# Patient Record
Sex: Female | Born: 1950 | Race: White | Hispanic: No | Smoking: Never smoker
Health system: Southern US, Community
[De-identification: ages and names within clinical notes are randomized; demographics above are authoritative.]

## PROBLEM LIST (undated history)

## (undated) DIAGNOSIS — I10 Essential (primary) hypertension: Secondary | ICD-10-CM

## (undated) DIAGNOSIS — M8580 Other specified disorders of bone density and structure, unspecified site: Secondary | ICD-10-CM

## (undated) DIAGNOSIS — E7800 Pure hypercholesterolemia, unspecified: Secondary | ICD-10-CM

## (undated) DIAGNOSIS — R42 Dizziness and giddiness: Secondary | ICD-10-CM

## (undated) DIAGNOSIS — R519 Headache, unspecified: Secondary | ICD-10-CM

## (undated) DIAGNOSIS — M1990 Unspecified osteoarthritis, unspecified site: Secondary | ICD-10-CM

## (undated) DIAGNOSIS — M545 Low back pain, unspecified: Secondary | ICD-10-CM

## (undated) DIAGNOSIS — R413 Other amnesia: Secondary | ICD-10-CM

## (undated) DIAGNOSIS — G43109 Migraine with aura, not intractable, without status migrainosus: Secondary | ICD-10-CM

## (undated) DIAGNOSIS — F419 Anxiety disorder, unspecified: Secondary | ICD-10-CM

## (undated) HISTORY — DX: Pure hypercholesterolemia, unspecified: E78.00

## (undated) HISTORY — DX: Unspecified osteoarthritis, unspecified site: M19.90

## (undated) HISTORY — DX: Headache, unspecified: R51.9

## (undated) HISTORY — DX: Other amnesia: R41.3

## (undated) HISTORY — DX: Low back pain, unspecified: M54.50

## (undated) HISTORY — DX: Migraine with aura, not intractable, without status migrainosus: G43.109

## (undated) HISTORY — DX: Other specified disorders of bone density and structure, unspecified site: M85.80

## (undated) HISTORY — DX: Dizziness and giddiness: R42

## (undated) HISTORY — DX: Anxiety disorder, unspecified: F41.9

## (undated) HISTORY — PX: TOTAL HIP ARTHROPLASTY: SHX124

## (undated) HISTORY — DX: Essential (primary) hypertension: I10

## (undated) HISTORY — PX: JOINT REPLACEMENT: SHX530

---
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
## 1999-04-08 ENCOUNTER — Other Ambulatory Visit: Admission: RE | Admit: 1999-04-08 | Discharge: 1999-04-08 | Payer: Self-pay | Admitting: Obstetrics and Gynecology

## 2001-10-22 ENCOUNTER — Other Ambulatory Visit: Admission: RE | Admit: 2001-10-22 | Discharge: 2001-10-22 | Payer: Self-pay | Admitting: Obstetrics and Gynecology

## 2001-10-30 ENCOUNTER — Encounter: Admission: RE | Admit: 2001-10-30 | Discharge: 2001-10-30 | Payer: Self-pay

## 2001-10-30 ENCOUNTER — Encounter: Payer: Self-pay | Admitting: Obstetrics and Gynecology

## 2003-05-07 ENCOUNTER — Other Ambulatory Visit: Admission: RE | Admit: 2003-05-07 | Discharge: 2003-05-07 | Payer: Self-pay | Admitting: Obstetrics and Gynecology

## 2003-06-11 ENCOUNTER — Ambulatory Visit (HOSPITAL_COMMUNITY): Admission: RE | Admit: 2003-06-11 | Discharge: 2003-06-11 | Payer: Self-pay | Admitting: Specialist

## 2003-12-22 ENCOUNTER — Encounter
Admission: RE | Admit: 2003-12-22 | Discharge: 2003-12-22 | Payer: Self-pay | Admitting: Physical Medicine and Rehabilitation

## 2004-09-19 ENCOUNTER — Encounter: Admission: RE | Admit: 2004-09-19 | Discharge: 2004-09-19 | Payer: Self-pay | Admitting: Obstetrics and Gynecology

## 2007-06-28 ENCOUNTER — Encounter: Admission: RE | Admit: 2007-06-28 | Discharge: 2007-06-28 | Payer: Self-pay | Admitting: Obstetrics and Gynecology

## 2008-01-21 ENCOUNTER — Encounter: Admission: RE | Admit: 2008-01-21 | Discharge: 2008-01-21 | Payer: Self-pay | Admitting: Obstetrics and Gynecology

## 2010-06-26 ENCOUNTER — Encounter: Payer: Self-pay | Admitting: Obstetrics and Gynecology

## 2013-02-27 ENCOUNTER — Ambulatory Visit (INDEPENDENT_AMBULATORY_CARE_PROVIDER_SITE_OTHER): Payer: BC Managed Care – PPO | Admitting: General Practice

## 2013-02-27 ENCOUNTER — Encounter: Payer: Self-pay | Admitting: General Practice

## 2013-02-27 VITALS — BP 111/79 | HR 53 | Temp 98.0°F | Ht 68.0 in | Wt 130.0 lb

## 2013-02-27 DIAGNOSIS — Z0000 Encounter for general adult medical examination without abnormal findings: Secondary | ICD-10-CM

## 2013-02-27 DIAGNOSIS — Z124 Encounter for screening for malignant neoplasm of cervix: Secondary | ICD-10-CM

## 2013-02-27 DIAGNOSIS — Z833 Family history of diabetes mellitus: Secondary | ICD-10-CM

## 2013-02-27 DIAGNOSIS — Z8349 Family history of other endocrine, nutritional and metabolic diseases: Secondary | ICD-10-CM

## 2013-02-27 LAB — POCT CBC
Granulocyte percent: 68.4 %G (ref 37–80)
HCT, POC: 39.7 % (ref 37.7–47.9)
Hemoglobin: 13.3 g/dL (ref 12.2–16.2)
MCV: 90 fL (ref 80–97)
POC Granulocyte: 4.4 (ref 2–6.9)
RBC: 4.4 M/uL (ref 4.04–5.48)
RDW, POC: 12.8 %

## 2013-02-27 LAB — POCT GLYCOSYLATED HEMOGLOBIN (HGB A1C): Hemoglobin A1C: 5.3

## 2013-02-27 NOTE — Progress Notes (Signed)
  Subjective:    Patient ID: Adrienne Jacobs, female    DOB: [birth year], 62 y.o.   MRN: [id number]  HPI Patient presents today for annual physical. She reports having a clogged ear sensation and onset was two months ago.     Review of Systems  Constitutional: Negative for fever and chills.  HENT: Negative for neck pain and neck stiffness.   Respiratory: Negative for chest tightness and shortness of breath.   Cardiovascular: Negative for chest pain and palpitations.  Gastrointestinal: Negative for vomiting, abdominal pain, diarrhea, constipation and blood in stool.  Genitourinary: Negative for dysuria, frequency, flank pain, difficulty urinating and pelvic pain.  Skin: Negative for rash.  Neurological: Negative for dizziness, weakness and headaches.       Objective:   Physical Exam  Constitutional: She is oriented to person, place, and time. She appears well-developed and well-nourished.  HENT:  Head: Normocephalic and atraumatic.  Mouth/Throat: Oropharynx is clear and moist.  Bilateral cerumen impaction  Eyes: Conjunctivae and EOM are normal. Pupils are equal, round, and reactive to light.  Neck: Normal range of motion. Neck supple. No thyromegaly present.  Cardiovascular: Normal rate, regular rhythm, normal heart sounds and intact distal pulses.   Pulmonary/Chest: Effort normal and breath sounds normal. No respiratory distress. Right breast exhibits no inverted nipple, no mass, no nipple discharge, no skin change and no tenderness. Left breast exhibits no inverted nipple, no mass, no nipple discharge, no skin change and no tenderness. Breasts are symmetrical.  Abdominal: Soft. Bowel sounds are normal. She exhibits no distension.  Genitourinary: Vagina normal and uterus normal. No breast swelling, tenderness, discharge or bleeding. No labial fusion. There is no rash, tenderness, lesion or injury on the right labia. There is no rash, tenderness, lesion or injury on the left labia. Uterus  is not deviated, not enlarged, not fixed and not tender. Cervix exhibits no motion tenderness, no discharge and no friability. Right adnexum displays no mass, no tenderness and no fullness. Left adnexum displays no mass, no tenderness and no fullness. No erythema, tenderness or bleeding around the vagina. No foreign body around the vagina. No signs of injury around the vagina. No vaginal discharge found.  Lymphadenopathy:    She has no cervical adenopathy.  Neurological: She is alert and oriented to person, place, and time.  Skin: Skin is warm and dry.  Psychiatric: She has a normal mood and affect.          Assessment & Plan:  1. Family history of diabetes mellitus - POCT glycosylated hemoglobin (Hb A1C)  2. Family history of hypothyroidism - Thyroid Panel With TSH  3. Annual physical exam - POCT CBC - CMP14+EGFR - Pap IG w/ reflex to HPV when ASC-U -RTO if symptoms develop -Patient verbalized understanding Coralie Keens, FNP-C

## 2013-02-27 NOTE — Patient Instructions (Addendum)

## 2013-02-28 LAB — CMP14+EGFR
ALT: 15 IU/L (ref 0–32)
Albumin/Globulin Ratio: 2.4 (ref 1.1–2.5)
Albumin: 4.5 g/dL (ref 3.6–4.8)
BUN: 13 mg/dL (ref 8–27)
Calcium: 9.9 mg/dL (ref 8.6–10.2)
GFR calc Af Amer: 89 mL/min/{1.73_m2} (ref >59)
GFR calc non Af Amer: 78 mL/min/{1.73_m2} (ref >59)
Glucose: 87 mg/dL (ref 65–99)
Potassium: 4.7 mmol/L (ref 3.5–5.2)
Total Bilirubin: 0.2 mg/dL (ref 0.0–1.2)
Total Protein: 6.4 g/dL (ref 6.0–8.5)

## 2013-02-28 LAB — THYROID PANEL WITH TSH
Free Thyroxine Index: 1.7 (ref 1.2–4.9)
T3 Uptake Ratio: 27 % (ref 24–39)
TSH: 2.8 u[IU]/mL (ref 0.450–4.500)

## 2013-03-05 LAB — PAP IG W/ RFLX HPV ASCU

## 2013-03-07 ENCOUNTER — Telehealth: Payer: Self-pay | Admitting: General Practice

## 2013-03-10 NOTE — Telephone Encounter (Signed)
Please review patients labs she is calling

## 2013-03-12 NOTE — Telephone Encounter (Signed)
Pt aware and rescheduled for pap.

## 2013-03-18 ENCOUNTER — Ambulatory Visit (INDEPENDENT_AMBULATORY_CARE_PROVIDER_SITE_OTHER): Payer: Self-pay | Admitting: General Practice

## 2013-03-18 ENCOUNTER — Encounter: Payer: Self-pay | Admitting: General Practice

## 2013-03-18 VITALS — BP 90/64 | HR 56 | Temp 98.3°F

## 2013-03-18 DIAGNOSIS — Z0000 Encounter for general adult medical examination without abnormal findings: Secondary | ICD-10-CM

## 2013-03-18 NOTE — Progress Notes (Signed)
  Subjective:    Patient ID: Adrienne Jacobs, female    DOB: 05/17/1951, 62 y.o.   MRN: [id number]  HPI Patient presents today for repeat pap, due to insufficient cells. Denies any changes.     Review of Systems  Constitutional: Negative for fever and chills.  Respiratory: Negative for chest tightness and shortness of breath.   Cardiovascular: Negative for chest pain and palpitations.  Gastrointestinal: Negative for vomiting, abdominal pain, diarrhea, constipation and blood in stool.  Genitourinary: Negative for dysuria, frequency, flank pain, difficulty urinating and pelvic pain.  Musculoskeletal: Negative for neck pain and neck stiffness.  Skin: Negative for rash.  Neurological: Negative for dizziness, weakness and headaches.       Objective:   Physical Exam  Constitutional: She is oriented to person, place, and time. She appears well-developed and well-nourished.  HENT:  Head: Normocephalic and atraumatic.  Right Ear: External ear normal.  Left Ear: External ear normal.  Mouth/Throat: Oropharynx is clear and moist.  Eyes: Conjunctivae and EOM are normal. Pupils are equal, round, and reactive to light.  Neck: Normal range of motion. Neck supple. No thyromegaly present.  Cardiovascular: Normal rate, regular rhythm, normal heart sounds and intact distal pulses.   Pulmonary/Chest: Effort normal and breath sounds normal. No respiratory distress. Right breast exhibits no inverted nipple, no mass, no nipple discharge, no skin change and no tenderness. Left breast exhibits no inverted nipple, no mass, no nipple discharge, no skin change and no tenderness. Breasts are symmetrical.  Abdominal: Soft. Bowel sounds are normal. She exhibits no distension.  Genitourinary: No breast swelling, tenderness, discharge or bleeding. No labial fusion. There is no rash, tenderness, lesion or injury on the right labia. There is no rash, tenderness, lesion or injury on the left labia. Uterus is not deviated,  not enlarged, not fixed and not tender. Cervix exhibits no motion tenderness, no discharge and no friability. Right adnexum displays no mass, no tenderness and no fullness. Left adnexum displays no mass, no tenderness and no fullness. No erythema, tenderness or bleeding around the vagina. No foreign body around the vagina. No signs of injury around the vagina. No vaginal discharge found.  Lymphadenopathy:    She has no cervical adenopathy.  Neurological: She is alert and oriented to person, place, and time.  Skin: Skin is warm and dry.  Psychiatric: She has a normal mood and affect.          Assessment & Plan:  1. Annual physical exam - Pap IG w/ reflex to HPV when ASC-U -will notify of results -Patient verbalized understanding Coralie Keens, FNP-C

## 2013-03-21 LAB — PAP IG W/ RFLX HPV ASCU

## 2014-07-16 ENCOUNTER — Ambulatory Visit (INDEPENDENT_AMBULATORY_CARE_PROVIDER_SITE_OTHER): Payer: Commercial Managed Care - HMO | Admitting: Nurse Practitioner

## 2014-07-16 ENCOUNTER — Encounter: Payer: Self-pay | Admitting: Nurse Practitioner

## 2014-07-16 ENCOUNTER — Ambulatory Visit (INDEPENDENT_AMBULATORY_CARE_PROVIDER_SITE_OTHER): Payer: Commercial Managed Care - HMO

## 2014-07-16 ENCOUNTER — Encounter (INDEPENDENT_AMBULATORY_CARE_PROVIDER_SITE_OTHER): Payer: Self-pay

## 2014-07-16 VITALS — BP 106/72 | HR 63 | Temp 97.2°F | Ht 68.0 in | Wt 134.0 lb

## 2014-07-16 DIAGNOSIS — R1031 Right lower quadrant pain: Secondary | ICD-10-CM | POA: Diagnosis not present

## 2014-07-16 DIAGNOSIS — M25561 Pain in right knee: Secondary | ICD-10-CM

## 2014-07-16 NOTE — Progress Notes (Signed)
   Subjective:    Patient ID: Adrienne Jacobs, female    DOB: August 06, 1950, 64 y.o.   MRN: [id number]  HPI Patient in today c/o: - right knee pain that started 1 month ago- mainly on medial side- denies injury- sitting cross legged increases pain. Straightening leg out decreases pain. - swelling in bil groin area and above pubic bone- intermittent pain in right groin area- pain occurs 2-3 x a day- rates pain 4/10. Patient has gained over 15 lbs in last few years.     Review of Systems  Constitutional: Negative.   HENT: Negative.   Respiratory: Negative.   Cardiovascular: Negative.   Gastrointestinal: Positive for constipation (on going for years).  Genitourinary: Negative.   Neurological: Negative.   Psychiatric/Behavioral: Negative.   All other systems reviewed and are negative.      Objective:   Physical Exam  Constitutional: She is oriented to person, place, and time. She appears well-developed and well-nourished.  Cardiovascular: Normal rate, regular rhythm and normal heart sounds.   Pulmonary/Chest: Effort normal and breath sounds normal.  Abdominal: Soft. She exhibits no distension and no mass. There is no tenderness. There is no rebound and no guarding.  Musculoskeletal:  FROM of right knee - no crepitus-  No effusion Ligaments intact   Neurological: She is alert and oriented to person, place, and time.  Skin: Skin is warm and dry.  Psychiatric: She has a normal mood and affect. Her behavior is normal. Judgment and thought content normal.   BP 106/72 mmHg  Pulse 63  Temp(Src) 97.2 F (36.2 C) (Oral)  Ht 5\' 8"  (1.727 m)  Wt 134 lb (60.782 kg)  BMI 20.38 kg/m2   Right knee xray- normal with good joint space-Preliminary reading by Ronnald Collum, FNP  [facility]      Assessment & Plan:  1. Right knee pain Rest No deep knee bends mobic 15mg  1 po qd #30 3 refills - DG Knee 1-2 Views Right; Future  2. Groin pain, right Keep diary of episodes Nothing found on  examination  Mary-Margaret Hassell Done, FNP

## 2014-07-16 NOTE — Patient Instructions (Signed)

## 2014-07-21 ENCOUNTER — Telehealth: Payer: Self-pay

## 2014-07-21 NOTE — Telephone Encounter (Signed)
Wants a referral 

## 2014-07-21 NOTE — Telephone Encounter (Signed)
Wants referral for what

## 2014-07-22 ENCOUNTER — Other Ambulatory Visit: Payer: Self-pay

## 2014-07-22 DIAGNOSIS — M25552 Pain in left hip: Secondary | ICD-10-CM

## 2014-07-22 DIAGNOSIS — M25561 Pain in right knee: Secondary | ICD-10-CM

## 2014-07-22 DIAGNOSIS — C4490 Unspecified malignant neoplasm of skin, unspecified: Secondary | ICD-10-CM

## 2014-07-23 ENCOUNTER — Ambulatory Visit: Payer: Self-pay | Admitting: Family

## 2014-07-31 DIAGNOSIS — Z9889 Other specified postprocedural states: Secondary | ICD-10-CM | POA: Diagnosis not present

## 2014-07-31 DIAGNOSIS — M25561 Pain in right knee: Secondary | ICD-10-CM | POA: Diagnosis not present

## 2014-07-31 DIAGNOSIS — M1811 Unilateral primary osteoarthritis of first carpometacarpal joint, right hand: Secondary | ICD-10-CM | POA: Diagnosis not present

## 2014-10-01 ENCOUNTER — Encounter: Payer: Self-pay | Admitting: Physician Assistant

## 2014-10-01 ENCOUNTER — Ambulatory Visit (INDEPENDENT_AMBULATORY_CARE_PROVIDER_SITE_OTHER): Payer: Commercial Managed Care - HMO

## 2014-10-01 ENCOUNTER — Ambulatory Visit (INDEPENDENT_AMBULATORY_CARE_PROVIDER_SITE_OTHER): Payer: Commercial Managed Care - HMO | Admitting: Physician Assistant

## 2014-10-01 VITALS — BP 124/63 | HR 55 | Temp 97.7°F | Ht 68.0 in | Wt 135.0 lb

## 2014-10-01 DIAGNOSIS — R3915 Urgency of urination: Secondary | ICD-10-CM | POA: Diagnosis not present

## 2014-10-01 DIAGNOSIS — K5900 Constipation, unspecified: Secondary | ICD-10-CM

## 2014-10-01 DIAGNOSIS — R140 Abdominal distension (gaseous): Secondary | ICD-10-CM

## 2014-10-01 DIAGNOSIS — N3090 Cystitis, unspecified without hematuria: Secondary | ICD-10-CM

## 2014-10-01 LAB — POCT URINALYSIS DIPSTICK
Bilirubin, UA: NEGATIVE
GLUCOSE UA: NEGATIVE
KETONES UA: NEGATIVE
Nitrite, UA: NEGATIVE
Protein, UA: NEGATIVE
Urobilinogen, UA: NEGATIVE
pH, UA: 7.5

## 2014-10-01 LAB — POCT UA - MICROSCOPIC ONLY
Casts, Ur, LPF, POC: NEGATIVE
Crystals, Ur, HPF, POC: NEGATIVE
MUCUS UA: NEGATIVE
YEAST UA: NEGATIVE

## 2014-10-01 MED ORDER — POLYETHYLENE GLYCOL 3350 17 GM/SCOOP PO POWD: 17.0000 g | Freq: Every day | ORAL | Status: DC

## 2014-10-01 MED ORDER — CIPROFLOXACIN HCL 500 MG PO TABS: 500.0000 mg | ORAL_TABLET | Freq: Two times a day (BID) | ORAL | Status: DC

## 2014-10-01 NOTE — Progress Notes (Signed)
   Subjective:    Patient ID: Adrienne Jacobs, female    DOB: 1950/10/21, 64 y.o.   MRN: [id number]  HPI 64 y/o female presents with c/o bloating in abdomen and increased "puffiness" in suprapubic area. Increased urination. States that she "feels like she did when she has pms" Post menopausal, stopped menstruating in her mid 95's. Last pelvic exam was 2 years ago. Intermittent constipation      Review of Systems  Gastrointestinal: Positive for constipation (BM daily, intermittent constipation) and abdominal distention. Negative for nausea, vomiting, abdominal pain, diarrhea and blood in stool.  Genitourinary: Positive for urgency and frequency. Negative for dysuria, hematuria, flank pain, vaginal discharge, difficulty urinating, vaginal pain, menstrual problem and pelvic pain.       Objective:   Physical Exam  Constitutional: She is oriented to person, place, and time. She appears well-developed and well-nourished. No distress.  Cardiovascular: Normal rate and regular rhythm.   Abdominal: Soft. She exhibits no distension and no mass. There is no tenderness. There is no rebound and no guarding.  Neurological: She is alert and oriented to person, place, and time.  Skin: She is not diaphoretic. No erythema.  Psychiatric: She has a normal mood and affect. Her behavior is normal. Judgment and thought content normal.  Nursing note and vitals reviewed.         Assessment & Plan:  1. Urgency of urination  - POCT UA - Microscopic Only - POCT urinalysis dipstick - ciprofloxacin (CIPRO) 500 MG tablet; Take 1 tablet (500 mg total) by mouth 2 (two) times daily.  Dispense: 20 tablet; Refill: 0  2. Non-gaseous abdominal distention  - DG Abd 1 View  3. Abdominal bloating  - DG Abd 1 View  4. Constipation, unspecified constipation type  - DG Abd 1 View - polyethylene glycol powder (GLYCOLAX/MIRALAX) powder; Take 17 g by mouth daily.  Dispense: 850 g; Refill: 1  5. Cystitis  -  ciprofloxacin (CIPRO) 500 MG tablet; Take 1 tablet (500 mg total) by mouth 2 (two) times daily.  Dispense: 20 tablet; Refill: 0    RTO 3 weeks   Shirely Toren A. Benjamin Stain PA-C

## 2014-10-01 NOTE — Patient Instructions (Signed)
Urinary Tract Infection Urinary tract infections (UTIs) can develop anywhere along your urinary tract. Your urinary tract is your body's drainage system for removing wastes and extra water. Your urinary tract includes two kidneys, two ureters, a bladder, and a urethra. Your kidneys are a pair of bean-shaped organs. Each kidney is about the size of your fist. They are located below your ribs, one on each side of your spine. CAUSES Infections are caused by microbes, which are microscopic organisms, including fungi, viruses, and bacteria. These organisms are so small that they can only be seen through a microscope. Bacteria are the microbes that most commonly cause UTIs. SYMPTOMS  Symptoms of UTIs may vary by age and gender of the patient and by the location of the infection. Symptoms in young women typically include a frequent and intense urge to urinate and a painful, burning feeling in the bladder or urethra during urination. Older women and men are more likely to be tired, shaky, and weak and have muscle aches and abdominal pain. A fever may mean the infection is in your kidneys. Other symptoms of a kidney infection include pain in your back or sides below the ribs, nausea, and vomiting. DIAGNOSIS To diagnose a UTI, your caregiver will ask you about your symptoms. Your caregiver also will ask to provide a urine sample. The urine sample will be tested for bacteria and white blood cells. White blood cells are made by your body to help fight infection. TREATMENT  Typically, UTIs can be treated with medication. Because most UTIs are caused by a bacterial infection, they usually can be treated with the use of antibiotics. The choice of antibiotic and length of treatment depend on your symptoms and the type of bacteria causing your infection. HOME CARE INSTRUCTIONS  If you were prescribed antibiotics, take them exactly as your caregiver instructs you. Finish the medication even if you feel better after you  have only taken some of the medication.  Drink enough water and fluids to keep your urine clear or pale yellow.  Avoid caffeine, tea, and carbonated beverages. They tend to irritate your bladder.  Empty your bladder often. Avoid holding urine for long periods of time.  Empty your bladder before and after sexual intercourse.  After a bowel movement, women should cleanse from front to back. Use each tissue only once. SEEK MEDICAL CARE IF:   You have back pain.  You develop a fever.  Your symptoms do not begin to resolve within 3 days. SEEK IMMEDIATE MEDICAL CARE IF:   You have severe back pain or lower abdominal pain.  You develop chills.  You have nausea or vomiting.  You have continued burning or discomfort with urination. MAKE SURE YOU:   Understand these instructions.  Will watch your condition.  Will get help right away if you are not doing well or get worse. Document Released: 03/01/2005 Document Revised: 11/21/2011 Document Reviewed: 06/30/2011 [facility] Patient Information 2015 [place], [place]. This information is not intended to replace advice given to you by your health care provider. Make sure you discuss any questions you have with your health care provider. Constipation Constipation is when a person has fewer than three bowel movements a week, has difficulty having a bowel movement, or has stools that are dry, hard, or larger than normal. As people grow older, constipation is more common. If you try to fix constipation with medicines that make you have a bowel movement (laxatives), the problem may get worse. Long-term laxative use may cause the muscles  of the colon to become weak. A low-fiber diet, not taking in enough fluids, and taking certain medicines may make constipation worse.  CAUSES   Certain medicines, such as antidepressants, pain medicine, iron supplements, antacids, and water pills.   Certain diseases, such as diabetes, irritable bowel syndrome (IBS),  thyroid disease, or depression.   Not drinking enough water.   Not eating enough fiber-rich foods.   Stress or travel.   Lack of physical activity or exercise.   Ignoring the urge to have a bowel movement.   Using laxatives too much.  SIGNS AND SYMPTOMS   Having fewer than three bowel movements a week.   Straining to have a bowel movement.   Having stools that are hard, dry, or larger than normal.   Feeling full or bloated.   Pain in the lower abdomen.   Not feeling relief after having a bowel movement.  DIAGNOSIS  Your health care provider will take a medical history and perform a physical exam. Further testing may be done for severe constipation. Some tests may include:  A barium enema X-ray to examine your rectum, colon, and, sometimes, your small intestine.   A sigmoidoscopy to examine your lower colon.   A colonoscopy to examine your entire colon. TREATMENT  Treatment will depend on the severity of your constipation and what is causing it. Some dietary treatments include drinking more fluids and eating more fiber-rich foods. Lifestyle treatments may include regular exercise. If these diet and lifestyle recommendations do not help, your health care provider may recommend taking over-the-counter laxative medicines to help you have bowel movements. Prescription medicines may be prescribed if over-the-counter medicines do not work.  HOME CARE INSTRUCTIONS   Eat foods that have a lot of fiber, such as fruits, vegetables, whole grains, and beans.  Limit foods high in fat and processed sugars, such as french fries, hamburgers, cookies, candies, and soda.   A fiber supplement may be added to your diet if you cannot get enough fiber from foods.   Drink enough fluids to keep your urine clear or pale yellow.   Exercise regularly or as directed by your health care provider.   Go to the restroom when you have the urge to go. Do not hold it.   Only take  over-the-counter or prescription medicines as directed by your health care provider. Do not take other medicines for constipation without talking to your health care provider first.  [place] IF:   You have bright red blood in your stool.   Your constipation lasts for more than 4 days or gets worse.   You have abdominal or rectal pain.   You have thin, pencil-like stools.   You have unexplained weight loss. MAKE SURE YOU:   Understand these instructions.  Will watch your condition.  Will get help right away if you are not doing well or get worse. Document Released: 02/18/2004 Document Revised: 05/27/2013 Document Reviewed: 03/03/2013 [facility] Patient Information 2015 [place], [place]. This information is not intended to replace advice given to you by your health care provider. Make sure you discuss any questions you have with your health care provider. Bloating Bloating is the feeling of fullness in your belly. You may feel as though your pants are too tight. Often the cause of bloating is overeating, retaining fluids, or having gas in your bowel. It is also caused by swallowing air and eating foods that cause gas. Irritable bowel syndrome is one of the most common causes of  bloating. Constipation is also a common cause. Sometimes more serious problems can cause bloating. SYMPTOMS  Usually there is a feeling of fullness, as though your abdomen is bulged out. There may be mild discomfort.  DIAGNOSIS  Usually no particular testing is necessary for most bloating. If the condition persists and seems to become worse, your caregiver may do additional testing.  TREATMENT   There is no direct treatment for bloating.  Do not put gas into the bowel. Avoid chewing gum and sucking on candy. These tend to make you swallow air. Swallowing air can also be a nervous habit. Try to avoid this.  Avoiding high residue diets will help. Eat foods with soluble fibers (examples  include root vegetables, apples, or barley) and substitute dairy products with soy and rice products. This helps irritable bowel syndrome.  If constipation is the cause, then a high residue diet with more fiber will help.  Avoid carbonated beverages.  Over-the-counter preparations are available that help reduce gas. Your pharmacist can help you with this. SEEK MEDICAL CARE IF:   Bloating continues and seems to be getting worse.  You notice a weight gain.  You have a weight loss but the bloating is getting worse.  You have changes in your bowel habits or develop nausea or vomiting. SEEK IMMEDIATE MEDICAL CARE IF:   You develop shortness of breath or swelling in your legs.  You have an increase in abdominal pain or develop chest pain. Document Released: 03/22/2006 Document Revised: 08/14/2011 Document Reviewed: 05/10/2007 [facility] Patient Information 2015 [place], [place]. This information is not intended to replace advice given to you by your health care provider. Make sure you discuss any questions you have with your health care provider.

## 2014-10-01 NOTE — Addendum Note (Signed)
Addended by: Earlene Plater on: 10/01/2014 05:45 PM   Modules accepted: Orders

## 2014-10-01 NOTE — Addendum Note (Signed)
Addended by: Earlene Plater on: 10/01/2014 05:10 PM   Modules accepted: Orders

## 2014-10-02 LAB — URINE CULTURE

## 2014-10-09 ENCOUNTER — Telehealth: Payer: Self-pay | Admitting: Nurse Practitioner

## 2014-10-10 NOTE — Telephone Encounter (Signed)
If her bowel movements are larger than before, once daily is ok. Do not take medicine on an empty stomach.    Adrienne Jacobs Stain PA-C

## 2014-10-12 NOTE — Telephone Encounter (Signed)
Bowel movements aren't any larger. Suggested taking it twice daily and to increase water intake.  If no improvement within a few days she will call back.  Patient stated understanding and agreement to plan.

## 2014-10-22 ENCOUNTER — Encounter: Payer: Self-pay | Admitting: Physician Assistant

## 2014-10-22 ENCOUNTER — Ambulatory Visit (INDEPENDENT_AMBULATORY_CARE_PROVIDER_SITE_OTHER): Payer: Commercial Managed Care - HMO | Admitting: Physician Assistant

## 2014-10-22 VITALS — BP 106/68 | HR 51 | Temp 97.4°F | Ht 68.0 in | Wt 135.4 lb

## 2014-10-22 DIAGNOSIS — Z0000 Encounter for general adult medical examination without abnormal findings: Secondary | ICD-10-CM | POA: Diagnosis not present

## 2014-10-22 DIAGNOSIS — R351 Nocturia: Secondary | ICD-10-CM | POA: Diagnosis not present

## 2014-10-22 DIAGNOSIS — R358 Other polyuria: Secondary | ICD-10-CM | POA: Diagnosis not present

## 2014-10-22 DIAGNOSIS — R3589 Other polyuria: Secondary | ICD-10-CM

## 2014-10-22 DIAGNOSIS — K5900 Constipation, unspecified: Secondary | ICD-10-CM | POA: Diagnosis not present

## 2014-10-22 DIAGNOSIS — R3915 Urgency of urination: Secondary | ICD-10-CM | POA: Diagnosis not present

## 2014-10-22 DIAGNOSIS — R3581 Nocturnal polyuria: Secondary | ICD-10-CM

## 2014-10-22 LAB — POCT CBC
Granulocyte percent: 70.5 %G (ref 37–80)
HCT, POC: 41.7 % (ref 37.7–47.9)
HEMOGLOBIN: 12.9 g/dL (ref 12.2–16.2)
Lymph, poc: 1.4 (ref 0.6–3.4)
MCH, POC: 28.7 pg (ref 27–31.2)
MCHC: 31 g/dL — AB (ref 31.8–35.4)
MCV: 92.5 fL (ref 80–97)
MPV: 7.8 fL (ref 0–99.8)
POC GRANULOCYTE: 4.1 (ref 2–6.9)
POC LYMPH %: 24.1 % (ref 10–50)
Platelet Count, POC: 250 10*3/uL (ref 142–424)
RBC: 4.51 M/uL (ref 4.04–5.48)
RDW, POC: 12.7 %
WBC: 5.8 10*3/uL (ref 4.6–10.2)

## 2014-10-22 NOTE — Progress Notes (Signed)
   Subjective:    Patient ID: Adrienne Jacobs, female    DOB: January 24, 1951, 64 y.o.   MRN: [id number]  HPI 64 y/o female presents for 3 week recheck of abdominal bloat and fullness. Xray at last visit indicated large amount of stool in abdomen. She has been taking Miralax twice daily with 1 large bowel movement daily. She feels like the c/o "fullness" and bloat has decreased with the Miralax. However, she is still have urinary frequency and has to get up twice during the night to urinate. She does not take any medications that would cause this. Supplements include Magnesium, Calcium, Msm, Vit C , B complex qam. She drinks a lot of water, coffee in the am. She states that she feels like she has to urinate every 1/2 hour x several months, which is unusual for her.     Review of Systems  Endocrine: Positive for polyuria.  Genitourinary: Positive for urgency and frequency. Negative for dysuria and difficulty urinating.       Objective:   Physical Exam  Constitutional: She is oriented to person, place, and time. She appears well-developed and well-nourished. No distress.  HENT:  Head: Normocephalic and atraumatic.  Cardiovascular:  Bradycardic   Pulmonary/Chest: Effort normal.  Abdominal: Soft. Bowel sounds are normal. She exhibits no distension and no mass. There is tenderness (mild suprapubic). There is no rebound and no guarding.  Neurological: She is alert and oriented to person, place, and time.  Skin: She is not diaphoretic.  Psychiatric: She has a normal mood and affect. Her behavior is normal. Judgment and thought content normal.  Nursing note and vitals reviewed.         Assessment & Plan:  1. Polyuria  - POCT CBC - CMP14+EGFR  2. Nocturnal polyuria  - POCT CBC - CMP14+EGFR  3. Preventative health care  - Thyroid Panel With TSH - Vit D  25 hydroxy (rtn osteoporosis monitoring)  4. Constipation, unspecified constipation type - continue Miralax as directed daily     Will wait until lab results are obtained to determine follow up and further treatment plan. May refer to Urology if labs WNL  Tony Granquist A. Benjamin Stain PA-C

## 2014-10-23 LAB — THYROID PANEL WITH TSH
FREE THYROXINE INDEX: 1.6 (ref 1.2–4.9)
T3 UPTAKE RATIO: 23 % — AB (ref 24–39)
T4 TOTAL: 7.1 ug/dL (ref 4.5–12.0)
TSH: 2.94 u[IU]/mL (ref 0.450–4.500)

## 2014-10-23 LAB — CMP14+EGFR
ALK PHOS: 73 IU/L (ref 39–117)
ALT: 14 IU/L (ref 0–32)
AST: 21 IU/L (ref 0–40)
Albumin/Globulin Ratio: 1.9 (ref 1.1–2.5)
Albumin: 4.3 g/dL (ref 3.6–4.8)
BILIRUBIN TOTAL: 0.2 mg/dL (ref 0.0–1.2)
BUN/Creatinine Ratio: 12 (ref 11–26)
BUN: 9 mg/dL (ref 8–27)
CALCIUM: 10.1 mg/dL (ref 8.7–10.3)
CHLORIDE: 96 mmol/L — AB (ref 97–108)
CO2: 25 mmol/L (ref 18–29)
Creatinine, Ser: 0.78 mg/dL (ref 0.57–1.00)
GFR calc Af Amer: 92 mL/min/{1.73_m2} (ref >59)
GFR calc non Af Amer: 80 mL/min/{1.73_m2} (ref >59)
GLUCOSE: 91 mg/dL (ref 65–99)
Globulin, Total: 2.3 g/dL (ref 1.5–4.5)
POTASSIUM: 4.1 mmol/L (ref 3.5–5.2)
Sodium: 138 mmol/L (ref 134–144)
TOTAL PROTEIN: 6.6 g/dL (ref 6.0–8.5)

## 2014-10-23 LAB — VITAMIN D 25 HYDROXY (VIT D DEFICIENCY, FRACTURES): Vit D, 25-Hydroxy: 44.8 ng/mL (ref 30.0–100.0)

## 2014-10-26 NOTE — Addendum Note (Signed)
Addended by: Jamelle Haring on: 10/26/2014 12:13 PM   Modules accepted: Orders

## 2014-11-10 ENCOUNTER — Telehealth: Payer: Self-pay | Admitting: Physician Assistant

## 2014-11-10 ENCOUNTER — Other Ambulatory Visit: Payer: Self-pay | Admitting: Physician Assistant

## 2014-11-10 DIAGNOSIS — K5900 Constipation, unspecified: Secondary | ICD-10-CM

## 2014-11-10 MED ORDER — POLYETHYLENE GLYCOL 3350 17 GM/SCOOP PO POWD: 17.0000 g | Freq: Every day | ORAL | Status: DC

## 2014-11-10 NOTE — Telephone Encounter (Signed)
Prescription sent to pharmacy  Kentarius Partington A. Reakwon Barren PA-C   

## 2014-11-10 NOTE — Telephone Encounter (Signed)
Pt notified RX sent into pharmacy 

## 2014-11-10 NOTE — Telephone Encounter (Signed)
Please review

## 2014-11-11 ENCOUNTER — Other Ambulatory Visit: Payer: Self-pay | Admitting: *Deleted

## 2014-11-11 ENCOUNTER — Telehealth: Payer: Self-pay | Admitting: Family

## 2014-11-11 DIAGNOSIS — K5900 Constipation, unspecified: Secondary | ICD-10-CM

## 2014-11-11 MED ORDER — POLYETHYLENE GLYCOL 3350 17 GM/SCOOP PO POWD: 17.0000 g | Freq: Two times a day (BID) | ORAL | Status: DC | PRN

## 2014-11-11 NOTE — Telephone Encounter (Signed)
Rx sent to pharmacy. Patient notified. 

## 2014-12-29 ENCOUNTER — Telehealth: Payer: Self-pay | Admitting: Nurse Practitioner

## 2014-12-29 NOTE — Telephone Encounter (Signed)
Patient wanted to discuss what each lab was for and review result again.

## 2015-04-02 ENCOUNTER — Telehealth: Payer: Self-pay | Admitting: Nurse Practitioner

## 2015-05-06 DIAGNOSIS — N3941 Urge incontinence: Secondary | ICD-10-CM | POA: Diagnosis not present

## 2015-05-06 DIAGNOSIS — R358 Other polyuria: Secondary | ICD-10-CM | POA: Diagnosis not present

## 2015-05-10 ENCOUNTER — Telehealth: Payer: Self-pay | Admitting: Family Medicine

## 2015-05-10 NOTE — Telephone Encounter (Signed)
Spoke with patient and went over options of having a mammogram. I discussed the importance of having a mammogram with the patient.

## 2015-07-01 ENCOUNTER — Ambulatory Visit (INDEPENDENT_AMBULATORY_CARE_PROVIDER_SITE_OTHER): Payer: Commercial Managed Care - HMO | Admitting: Pediatrics

## 2015-07-01 ENCOUNTER — Encounter: Payer: Self-pay | Admitting: Pediatrics

## 2015-07-01 VITALS — BP 116/70 | HR 55 | Temp 97.1°F | Ht 68.0 in | Wt 141.0 lb

## 2015-07-01 DIAGNOSIS — R42 Dizziness and giddiness: Secondary | ICD-10-CM | POA: Diagnosis not present

## 2015-07-01 DIAGNOSIS — H9193 Unspecified hearing loss, bilateral: Secondary | ICD-10-CM | POA: Diagnosis not present

## 2015-07-01 DIAGNOSIS — R739 Hyperglycemia, unspecified: Secondary | ICD-10-CM

## 2015-07-01 DIAGNOSIS — D485 Neoplasm of uncertain behavior of skin: Secondary | ICD-10-CM

## 2015-07-01 DIAGNOSIS — Z1239 Encounter for other screening for malignant neoplasm of breast: Secondary | ICD-10-CM | POA: Diagnosis not present

## 2015-07-01 DIAGNOSIS — L821 Other seborrheic keratosis: Secondary | ICD-10-CM | POA: Diagnosis not present

## 2015-07-01 DIAGNOSIS — Z1211 Encounter for screening for malignant neoplasm of colon: Secondary | ICD-10-CM

## 2015-07-01 DIAGNOSIS — R358 Other polyuria: Secondary | ICD-10-CM | POA: Diagnosis not present

## 2015-07-01 DIAGNOSIS — R3589 Other polyuria: Secondary | ICD-10-CM

## 2015-07-01 LAB — POCT GLYCOSYLATED HEMOGLOBIN (HGB A1C): HEMOGLOBIN A1C: 5.4

## 2015-07-01 NOTE — Progress Notes (Signed)
Subjective:    Patient ID: Tynae Dreessen, female    DOB: [birth year], 65 y.o.   MRN: [id number]  CC: multiple problem f/u, sore tail bone  HPI: Sharline Schomburg is a 65 y.o. female presenting for multiple complaints  Hearing getting worse. 2 years ago had hearing test, thinks hearing slightly worse, consonants are hardest. Doesn't want hearing aides yet.  Not working now, was Control and instrumentation engineer, used to [employer], now feels [employer] is not as good as it once was for small things. Has not been misplacing her keys, doesn't forget where she is going.  Fell through cloth chair 1 month ago. Has had tail bone pain since then. Had hip replacement 2 years ago, didn't go back to work after that. Thinks hip pain was worsened after the fall.  Hip has been doing pretty well since replacement  Regular exercising, swimming  An hour twice a week Started doing it for her back. Hip pain not preventing her from staying active.  Dizzy spells: ongoing problem for over a year. Lasts for a few seconds then goes away, not happening regularly, a couple times a month.  Has several irritated spots on her skin around waist band. Also with small growth on R inner thigh that catches on clothes, would like to have it removed.   Depression screen [facility] 2/9 07/01/2015 07/16/2014  Decreased Interest 0 1  Down, Depressed, Hopeless 0 1  PHQ - 2 Score 0 2  Altered sleeping - 1  Tired, decreased energy - 1  Change in appetite - 1  Feeling bad or failure about yourself  - 1  Trouble concentrating - 1  Moving slowly or fidgety/restless - 1  Suicidal thoughts - 0  PHQ-9 Score - 8     Relevant past medical, surgical, family and social history reviewed and updated as indicated. Interim medical history since our last visit reviewed. Allergies and medications reviewed and updated.    ROS: Per HPI unless specifically indicated above  History  Smoking status  . Never Smoker   Smokeless tobacco  . Not on  file    Past Medical History There are no active problems to display for this patient.   No current outpatient prescriptions on file.   No current facility-administered medications for this visit.       Objective:    BP 116/70 mmHg  Pulse 55  Temp(Src) 97.1 F (36.2 C) (Oral)  Ht 5\' 8"  (1.727 m)  Wt 141 lb (63.957 kg)  BMI 21.44 kg/m2  Wt Readings from Last 3 Encounters:  07/01/15 141 lb (63.957 kg)  10/22/14 135 lb 6.4 oz (61.417 kg)  10/01/14 135 lb (61.236 kg)     Gen: NAD, alert, cooperative with exam, NCAT EYES: EOMI, no scleral injection or icterus ENT:  TMs pearly gray b/l, OP without erythema LYMPH: no cervical LAD CV: NRRR, normal S1/S2, no murmur, distal pulses 2+ b/l Resp: CTABL, no wheezes, normal WOB Abd: +BS, soft, NTND. no guarding or organomegaly Ext: No edema, warm Neuro: Alert and oriented, strength equal b/l UE and LE, coordination grossly normal MSK: normal muscle bulk Skin: several SKs around waist, 33mm wide verucous growth R inner thigh     Assessment & Plan:    Chrisandra was seen today for multiple complaints.  Diagnoses and all orders for this visit:  Hearing loss, bilateral -     Ambulatory referral to Audiology  Screening for breast cancer Already has appt for mammogram.  Screening for colon  cancer No prior colonoscopy -     Ambulatory referral to Gastroenterology  Polyuria -     POCT glycosylated hemoglobin (Hb A1C)  Hyperglycemia -     POCT glycosylated hemoglobin (Hb A1C)  Vertigo Intermittent problem, not effecting life regularly, symptoms unchanged for months. Let me know if worsening. Discussed Epley maneuver she can try at home. Can refer to PT for vestibular rehab if symptoms continue.  Seborrheic keratoses RTC for likely cosmetic removal of SKs.  Neoplasm of uncertain behavior of skin RTC for removal.     Follow up plan: Prn for skin exam as above, biopsy  Assunta Found, MD [place]  [facility] 07/01/2015, 12:30 PM

## 2015-07-01 NOTE — Patient Instructions (Signed)
Epley Maneuver Self-Care  WHAT IS THE EPLEY MANEUVER?  The Epley maneuver is an exercise you can do to relieve symptoms of benign paroxysmal positional vertigo (BPPV). This condition is often just referred to as vertigo. BPPV is caused by the movement of tiny crystals (canaliths) inside your inner ear. The accumulation and movement of canaliths in your inner ear causes a sudden spinning sensation (vertigo) when you move your head to certain positions. Vertigo usually lasts about 30 seconds. BPPV usually occurs in just one ear. If you get vertigo when you lie on your left side, you probably have BPPV in your left ear. Your health care provider can tell you which ear is involved.   BPPV may be caused by a head injury. Many people older than 50 get BPPV for unknown reasons. If you have been diagnosed with BPPV, your health care provider may teach you how to do this maneuver. BPPV is not life threatening (benign) and usually goes away in time.   WHEN SHOULD I PERFORM THE EPLEY MANEUVER?  You can do this maneuver at home whenever you have symptoms of vertigo. You may do the Epley maneuver up to 3 times a day until your symptoms of vertigo go away.  HOW SHOULD I DO THE EPLEY MANEUVER?  1. Sit on the edge of a bed or table with your back straight. Your legs should be extended or hanging over the edge of the bed or table.    2. Turn your head halfway toward the affected ear.    3. Lie backward quickly with your head turned until you are lying flat on your back. You may want to position a pillow under your shoulders.    4. Hold this position for 30 seconds. You may experience an attack of vertigo. This is normal. Hold this position until the vertigo stops.  5. Then turn your head to the opposite direction until your unaffected ear is facing the floor.    6. Hold this position for 30 seconds. You may experience an attack of vertigo. This is normal. Hold this position until the vertigo stops.  7. Now turn your whole body to  the same side as your head. Hold for another 30 seconds.    8. You can then sit back up.  ARE THERE RISKS TO THIS MANEUVER?  In some cases, you may have other symptoms (such as changes in your vision, weakness, or numbness). If you have these symptoms, stop doing the maneuver and call your health care provider. Even if doing these maneuvers relieves your vertigo, you may still have dizziness. Dizziness is the sensation of light-headedness but without the sensation of movement. Even though the Epley maneuver may relieve your vertigo, it is possible that your symptoms will return within 5 years.  WHAT SHOULD I DO AFTER THIS MANEUVER?  After doing the Epley maneuver, you can return to your normal activities. Ask your doctor if there is anything you should do at home to prevent vertigo. This may include:  · Sleeping with two or more pillows to keep your head elevated.  · Not sleeping on the side of your affected ear.  · Getting up slowly from bed.  · Avoiding sudden movements during the day.  · Avoiding extreme head movement, like looking up or bending over.  · Wearing a cervical collar to prevent sudden head movements.  WHAT SHOULD I DO IF MY SYMPTOMS GET WORSE?  Call your health care provider if your vertigo gets worse. Call your provider right way if   you have other symptoms, including:   · Nausea.  · Vomiting.  · Headache.  · Weakness.  · Numbness.  · Vision changes.     This information is not intended to replace advice given to you by your health care provider. Make sure you discuss any questions you have with your health care provider.     Document Released: 05/27/2013 Document Reviewed: 05/27/2013  Elsevier Interactive Patient Education ©2016 Elsevier Inc.

## 2015-07-05 DIAGNOSIS — M6281 Muscle weakness (generalized): Secondary | ICD-10-CM | POA: Diagnosis not present

## 2015-07-05 DIAGNOSIS — N3946 Mixed incontinence: Secondary | ICD-10-CM | POA: Diagnosis not present

## 2015-07-05 DIAGNOSIS — M62838 Other muscle spasm: Secondary | ICD-10-CM | POA: Diagnosis not present

## 2015-07-05 DIAGNOSIS — R278 Other lack of coordination: Secondary | ICD-10-CM | POA: Diagnosis not present

## 2015-07-05 DIAGNOSIS — R102 Pelvic and perineal pain: Secondary | ICD-10-CM | POA: Diagnosis not present

## 2015-07-06 DIAGNOSIS — Z1231 Encounter for screening mammogram for malignant neoplasm of breast: Secondary | ICD-10-CM | POA: Diagnosis not present

## 2015-07-06 LAB — HM MAMMOGRAPHY: HM Mammogram: NEGATIVE

## 2015-07-09 ENCOUNTER — Encounter: Payer: Self-pay | Admitting: *Deleted

## 2015-07-13 DIAGNOSIS — M62838 Other muscle spasm: Secondary | ICD-10-CM | POA: Diagnosis not present

## 2015-07-13 DIAGNOSIS — N3946 Mixed incontinence: Secondary | ICD-10-CM | POA: Diagnosis not present

## 2015-07-13 DIAGNOSIS — R102 Pelvic and perineal pain: Secondary | ICD-10-CM | POA: Diagnosis not present

## 2015-07-13 DIAGNOSIS — M6281 Muscle weakness (generalized): Secondary | ICD-10-CM | POA: Diagnosis not present

## 2015-07-13 DIAGNOSIS — R278 Other lack of coordination: Secondary | ICD-10-CM | POA: Diagnosis not present

## 2015-07-22 ENCOUNTER — Ambulatory Visit: Payer: Commercial Managed Care - HMO | Admitting: Audiology

## 2015-07-22 DIAGNOSIS — N3946 Mixed incontinence: Secondary | ICD-10-CM | POA: Diagnosis not present

## 2015-07-22 DIAGNOSIS — R102 Pelvic and perineal pain: Secondary | ICD-10-CM | POA: Diagnosis not present

## 2015-07-22 DIAGNOSIS — M62838 Other muscle spasm: Secondary | ICD-10-CM | POA: Diagnosis not present

## 2015-07-22 DIAGNOSIS — R278 Other lack of coordination: Secondary | ICD-10-CM | POA: Diagnosis not present

## 2015-07-22 DIAGNOSIS — M6281 Muscle weakness (generalized): Secondary | ICD-10-CM | POA: Diagnosis not present

## 2015-08-02 ENCOUNTER — Encounter: Payer: Self-pay | Admitting: Pediatrics

## 2015-08-02 ENCOUNTER — Ambulatory Visit (INDEPENDENT_AMBULATORY_CARE_PROVIDER_SITE_OTHER): Payer: Commercial Managed Care - HMO | Admitting: Pediatrics

## 2015-08-02 VITALS — BP 97/63 | HR 65 | Temp 97.1°F | Ht 68.0 in | Wt 140.0 lb

## 2015-08-02 DIAGNOSIS — L820 Inflamed seborrheic keratosis: Secondary | ICD-10-CM | POA: Diagnosis not present

## 2015-08-02 DIAGNOSIS — L919 Hypertrophic disorder of the skin, unspecified: Secondary | ICD-10-CM

## 2015-08-02 DIAGNOSIS — M545 Low back pain, unspecified: Secondary | ICD-10-CM

## 2015-08-02 DIAGNOSIS — B078 Other viral warts: Secondary | ICD-10-CM | POA: Diagnosis not present

## 2015-08-02 DIAGNOSIS — L819 Disorder of pigmentation, unspecified: Secondary | ICD-10-CM | POA: Diagnosis not present

## 2015-08-02 NOTE — Progress Notes (Signed)
    Subjective:    Patient ID: Adrienne Jacobs, female    DOB: [birth year], 65 y.o.   MRN: [id number]  CC: Skin tag removal and Wart Removal   HPI: Adrienne Jacobs is a 64 y.o. female presenting for Skin tag removal and Wart Removal  Also having back and hip pain Has been ongoing since fall 3 months ago Swims regularly, when she skips days of swimming starts to have regular pain again   Depression screen [facility] 2/9 08/02/2015 07/01/2015 07/16/2014  Decreased Interest 0 0 1  Down, Depressed, Hopeless 0 0 1  PHQ - 2 Score 0 0 2  Altered sleeping - - 1  Tired, decreased energy - - 1  Change in appetite - - 1  Feeling bad or failure about yourself  - - 1  Trouble concentrating - - 1  Moving slowly or fidgety/restless - - 1  Suicidal thoughts - - 0  PHQ-9 Score - - 8     Relevant past medical, surgical, family and social history reviewed and updated as indicated. Interim medical history since our last visit reviewed. Allergies and medications reviewed and updated.    ROS: Per HPI unless specifically indicated above  History  Smoking status  . Never Smoker   Smokeless tobacco  . Not on file       Objective:    BP 97/63 mmHg  Pulse 65  Temp(Src) 97.1 F (36.2 C) (Oral)  Ht 5\' 8"  (1.727 m)  Wt 140 lb (63.504 kg)  BMI 21.29 kg/m2  Wt Readings from Last 3 Encounters:  08/02/15 140 lb (63.504 kg)  07/01/15 141 lb (63.957 kg)  10/22/14 135 lb 6.4 oz (61.417 kg)     Gen: NAD, alert, cooperative with exam, NCAT EYES: EOMI, no scleral injection or icterus CV: WWP Resp: normal WOB Neuro: Alert and oriented, strength equal b/l UE and LE, coordination grossly normal Skin: 85mm well circumscribed papule, 1-21mm tall with verucous white center, edges now dark brown. Numerous flesh colored to light brown flat topped plaques across chest, along bra-line, on back, two on face, consistent with seborrheic keratoses. Several on back slightly red.     Assessment & Plan:    Adrienne Jacobs  was seen today for skin tag removal and wart removal, also with ongoing back pain despite the exercises she has been trying at home.  Diagnoses and all orders for this visit:  Pigmented skin lesion of uncertain nature See below procedure -     Pathology  Left-sided low back pain without sciatica -     Ambulatory referral to Physical Therapy  Seborrheic keratoses, inflamed See below procedure   PROCEDURES:  Shave biopsy:  Discussed risks, benefits and alternatives, pt agreed to proceed. Lesion and surrounding skin cleaned with betadine. 38mLs of lidocaine with epi injected under the lesion on R upper thigh. Shave biopsy completed with dermablade. Specimen sent for pathology.  Lesion destruction: Discussed risks, benefits and alternatives, pt agreed to proceed. Liquid nitrogen used to complete 3 freeze thaw cycles each on 9 total SK lesions across trunk.    Follow up plan: As needed  Adrienne Found, MD [place] [facility] 08/02/2015, 5:40 PM

## 2015-08-04 ENCOUNTER — Telehealth: Payer: Self-pay | Admitting: Pediatrics

## 2015-08-04 NOTE — Telephone Encounter (Signed)
Patient called stated that spots are not blistering from Monday.  Is this normal?  Does she need to come back in?

## 2015-08-05 LAB — PATHOLOGY

## 2015-08-05 NOTE — Telephone Encounter (Signed)
Lets give it a little longer. If they dont blister we can do it again, I'm here all day Friday/Sat morning, we can fit it in.

## 2015-08-10 ENCOUNTER — Ambulatory Visit: Payer: Commercial Managed Care - HMO | Attending: Pediatrics | Admitting: Physical Therapy

## 2015-08-10 ENCOUNTER — Encounter: Payer: Self-pay | Admitting: Physical Therapy

## 2015-08-10 DIAGNOSIS — M79651 Pain in right thigh: Secondary | ICD-10-CM | POA: Diagnosis not present

## 2015-08-10 DIAGNOSIS — M545 Low back pain, unspecified: Secondary | ICD-10-CM

## 2015-08-10 DIAGNOSIS — R6889 Other general symptoms and signs: Secondary | ICD-10-CM | POA: Diagnosis not present

## 2015-08-10 NOTE — Patient Instructions (Addendum)
Iliotibial Band Stretch   Stand with right hip away from wall. Cross other leg in front and use it and arm for support. Lean toward wall until a stretch is felt on outside of hip near wall. Keep that leg straight. Hold _30 or more seconds. Repeat _3___ times. Do _2-3___ sessions per day.   Iliotibial Band Stretch, Side-Lying   Lie on side, back to edge of bed, top arm in front. Allow top leg to drape behind over edge. Hold 30 or more seconds.  Repeat 3 times per session. Do _2-3 sessions per day.  Plank   Support body on hands and feet. Keep hips in line with torso and arms straight under chest. Avoid locking elbows. Hold as long as you can. Do 5 times.

## 2015-08-10 NOTE — Therapy (Signed)
[facility] [place], [place], [address] Phone: [phone]   Fax:  [phone]  Physical Therapy Evaluation  Patient Details  Name: Adrienne Jacobs MRN: [id number] Date of Birth: 09/24/1950 Referring Provider: Assunta Found MD  Encounter Date: 08/10/2015      PT End of Session - 08/10/15 1125    Visit Number 1   Number of Visits 12   Date for PT Re-Evaluation 09/21/15   PT Start Time 1125   PT Stop Time 1210   PT Time Calculation (min) 45 min   Activity Tolerance Patient tolerated treatment well      Past Medical History  Diagnosis Date  . Arthritis   . Osteopenia   . Osteopenia     Past Surgical History  Procedure Laterality Date  . Total hip arthroplasty    . Joint replacement    . Total hip arthroplasty      There were no vitals filed for this visit.  Visit Diagnosis:  Bilateral low back pain without sciatica - Plan: PT plan of care cert/re-cert  Right thigh pain - Plan: PT plan of care cert/re-cert  Activity intolerance - Plan: PT plan of care cert/re-cert      Subjective Assessment - 08/10/15 1122    Subjective Patient began having pain in anterior thigh one week ago which may have been since starting a psoas stretch. She has a long h/o back problems which she manages with stretches and swimming. She fell iin December through a rotted out chair and landed on her tailbone. Three days later reached for something and her back went out and she was in bed for three days,   Pertinent History L THA 9/6; OP   Limitations Sitting;Standing   How long can you sit comfortably? 10 minutes   How long can you stand comfortably? 15 min   Diagnostic tests none   Currently in Pain? Yes   Pain Score 4    Pain Location Back   Pain Orientation Lower;Left;Right  right worse   Pain Descriptors / Indicators Aching   Pain Radiating Towards anterior R thigh   Pain Onset More than a month ago   Pain Frequency Constant    Aggravating Factors  sitting or lying down too long; sitting aggravates ant thigh pain   Pain Relieving Factors walking   Effect of Pain on Daily Activities long car rides really hurt and she lives an hour from [place]; can't lift more than 10 #            [facility] PT Assessment - 08/10/15 0001    Assessment   Medical Diagnosis L sided LBP without sciatica   Referring Provider Assunta Found MD   Onset Date/Surgical Date 05/12/15   Next MD Visit not scheduled   Balance Screen   Has the patient fallen in the past 6 months Yes  fell through chair   How many times? 1   Has the patient had a decrease in activity level because of a fear of falling?  No   Is the patient reluctant to leave their home because of a fear of falling?  No   Prior Function   Level of Independence Independent   Vocation Retired   Observation/Other Assessments   Focus on Therapeutic Outcomes (FOTO)  53% limited   ROM / Strength   AROM / PROM / Strength Strength   Strength   Overall Strength Comments Left hip 4/5   Flexibility   Soft Tissue Assessment Lindaann Slough  Length --  WNL BLE   Palpation   Palpation comment tenderness of R TFL, bil gluts along superior illia and QL   Special Tests    Special Tests --  +Ober on R, even pelvis                           PT Education - 08/15/2015 1216    Education provided Yes   Education Details HEP   Person(s) Educated Patient   Methods Explanation;Demonstration;Handout   Comprehension Verbalized understanding;Returned demonstration          PT Short Term Goals - 08/15/15 1234    PT SHORT TERM GOAL #1   Title I with HEP   Time 2   Period Weeks   Status New           PT Long Term Goals - August 15, 2015 1234    PT LONG TERM GOAL #1   Title --               Plan - 08/15/2015 1216    Clinical Impression Statement Patient presents with long history of LBP and new c/o pain in R anterior thigh. She is strong in BLE and in transverse  abdominals but has difficulty with higher level core exercises.    Pt will benefit from skilled therapeutic intervention in order to improve on the following deficits Pain;Decreased strength;Decreased activity tolerance   Rehab Potential Good   PT Frequency 1x / week   PT Duration 2 weeks   PT Treatment/Interventions Electrical Stimulation;Moist Heat;Therapeutic exercise;Neuromuscular re-education;Manual techniques;Patient/family education;ADLs/Self Care Home Management   PT Next Visit Plan Review HEP, body mechanics and ADL Modification; progress HEP as patient to be out of town for one month. Then hold chart until patient returns. Provide info on TENS.   PT Home Exercise Plan ITB stretch, prone press ups, plank, abdominal bicycles and alt leg press with ab set.  went through TrAb series to determine strength   Consulted and Agree with Plan of Care Patient          G-Codes - 2015-08-15 1238    Functional Assessment Tool Used FOTO 53% LIMITED   Functional Limitation Mobility: Walking and moving around   Mobility: Walking and Moving Around Current Status [phone]) At least 40 percent but less than 60 percent impaired, limited or restricted   Mobility: Walking and Moving Around Goal Status [phone]) At least 20 percent but less than 40 percent impaired, limited or restricted       Problem List There are no active problems to display for this patient.   Madelyn Flavors PT  2015/08/15, 12:44 PM  [facility] [address] [place], [place], [address] Phone: [phone]   Fax:  [phone]  Name: Adrienne Jacobs MRN: [id number] Date of Birth: 05-06-1951

## 2015-08-16 ENCOUNTER — Ambulatory Visit: Payer: Commercial Managed Care - HMO | Admitting: Physical Therapy

## 2015-08-16 DIAGNOSIS — R6889 Other general symptoms and signs: Secondary | ICD-10-CM | POA: Diagnosis not present

## 2015-08-16 DIAGNOSIS — M79651 Pain in right thigh: Secondary | ICD-10-CM | POA: Diagnosis not present

## 2015-08-16 DIAGNOSIS — M545 Low back pain, unspecified: Secondary | ICD-10-CM

## 2015-08-16 NOTE — Patient Instructions (Signed)
  Bridge [employer] small of back into mat, maintain pelvic tilt, roll up one vertebrae at a time. Focus on engaging posterior hip muscles. Hold for _10 seconds. Repeat _20___ times. 1-2 times per day.  Copyright  VHI. All rights reserved.   Abductor Strength: Bridge Pose (Strap)   Make strap wide enough to brace knees at hip width. Press into strap with knees. Hold for _10 seconds. Repeat _5___ times. Do 1-3 sets. 1-2 times per day.  Copyright  VHI. All rights reserved.    Bridging: with Straight Leg Raise   With legs bent, lift buttocks off floor. Then slowly extend one knee, keeping stomach tight and pelvis level. Repeat  10 times per set each leg. Do _1-3 sets per session. Do __1-2 sessions per day.  http://orth.exer.us/1104   Copyright  VHI. All rights reserved.       Bridge Pose, One Leg   Bring knee to chest. Roll up from tailbone to bridge pose on supporting leg. Focus on engaging posterior hip muscles. Hold for 5 seconds. Repeat _10___ times each leg. Do 1-3 sets. 1-2 times per day.  Copyright  VHI. All rights reserved.    Quadruped Alternate Hip Extension   Shift weight to one side and raise opposite leg. Keep trunk steady. 10___ reps per set, _1-3__ sets per day, ___ days per week Repeat with other leg.  Bracing With Arm / Leg Raise (Quadruped)  On hands and knees find neutral spine. Tighten pelvic floor and abdominals and hold. Alternating, lift arm to shoulder level and opposite leg to hip level. Repeat 10___ times. Do _1-3__ times a day.  Quadruped: Hip Abduction / Knee Flexion (Active)   On hands and knees, lift right bent knee out to the side.  Complete 1-3__ sets of 10___ repetitions. Perform _1 __ sessions per day.  Copyright  VHI. All rights reserved.

## 2015-08-16 NOTE — Therapy (Addendum)
[facility] [place], [place], [address] Phone: [phone]   Fax:  [phone]  Physical Therapy Treatment  Patient Details  Name: Adrienne Jacobs MRN: [id number] Date of Birth: Oct 10, 1950 Referring Provider: Assunta Found MD  Encounter Date: 08/16/2015      PT End of Session - 08/16/15 1035    Visit Number 2   Number of Visits 12   Date for PT Re-Evaluation 09/21/15   PT Start Time 0321   PT Stop Time 1120   PT Time Calculation (min) 45 min   Activity Tolerance Patient tolerated treatment well   Behavior During Therapy [facility] for tasks assessed/performed      Past Medical History  Diagnosis Date  . Arthritis   . Osteopenia   . Osteopenia     Past Surgical History  Procedure Laterality Date  . Total hip arthroplasty    . Joint replacement    . Total hip arthroplasty      There were no vitals filed for this visit.  Visit Diagnosis:  Bilateral low back pain without sciatica      Subjective Assessment - 08/16/15 1130    Subjective Patient denies any anterior thigh pain since last visit.   Pertinent History L THA 9/6; OP   Limitations Sitting;Standing   How long can you sit comfortably? 10 minutes   How long can you stand comfortably? 15 min   Diagnostic tests none   Currently in Pain? Yes   Pain Score 4    Pain Location Back   Pain Orientation Right;Left;Lower   Pain Descriptors / Indicators Aching   Pain Onset More than a month ago   Pain Frequency Constant   Aggravating Factors  sitting or lying down too long   Pain Relieving Factors walking   Effect of Pain on Daily Activities limited                         [facility] Adult PT Treatment/Exercise - 08/16/15 0001    Exercises   Exercises Lumbar   Lumbar Exercises: Stretches   Hip Flexor Stretch 1 rep;20 seconds  kneeling and supine   Hip Flexor Stretch Limitations patient no longer demonstrates HF tightness but has soreness on R  side   Prone on Elbows Stretch 1 rep;20 seconds   Press Ups --  10 reps x 2   ITB Stretch 1 rep;30 seconds  Bil   Piriformis Stretch 1 rep;10 seconds  Bil   Lumbar Exercises: Standing   Other Standing Lumbar Exercises figure 8 with weighted ball; also performed iin supine x 15 ea   Other Standing Lumbar Exercises Rotation with green theraband x 10 Bil   Lumbar Exercises: Supine   Ab Set --  Bridge series x 10 ea bil, march, clam and knee ext   Bridge 5 reps  and progression   Lumbar Exercises: Prone   Straight Leg Raise --   Opposite Arm/Leg Raise --   Other Prone Lumbar Exercises --   Lumbar Exercises: Quadruped   [place] 5 reps   Straight Leg Raise 10 reps  Bil   Opposite Arm/Leg Raise 10 reps  bil   Opposite Arm/Leg Raise Limitations can only hold 5 seconds   Plank plank full and on hands/knees                PT Education - 08/16/15 1132    Education provided Yes   Education Details Reviewed HEP from last  visit and modified as needed. Added to HEP.   Person(s) Educated Patient   Methods Explanation;Demonstration;Handout   Comprehension Verbalized understanding;Returned demonstration          PT Short Term Goals - 08/16/15 1133    PT SHORT TERM GOAL #1   Title I with HEP   Time 2   Period Weeks   Status Achieved           PT Long Term Goals - 08/10/15 1234    PT LONG TERM GOAL #1   Title --               Plan - 08/16/15 1133    Clinical Impression Statement Patient reports no anterior thigh pain since last visit and states that press ups feel really good on her back. She was able to tolerate new exercises with a slight increase in LBP, and was able to decrease the pain immediately with prone press ups. Patient is I with HEP and will f/u with [place] after she retruns from her one month trip.   Pt will benefit from skilled therapeutic intervention in order to improve on the following deficits Pain;Decreased strength;Decreased activity  tolerance   Rehab Potential Good   PT Frequency 1x / week   PT Duration 2 weeks   PT Treatment/Interventions Electrical Stimulation;Moist Heat;Therapeutic exercise;Neuromuscular re-education;Manual techniques;Patient/family education;ADLs/Self Care Home Management   PT Next Visit Plan Patient to be out of town for one month. Hold chart open until patient returns. Then reassess, d/c or set new goals.   PT Home Exercise Plan bridge series, quadriped series, standing rotation with green band   Consulted and Agree with Plan of Care Patient        Problem List There are no active problems to display for this patient.  Madelyn Flavors PT  08/16/2015, 11:40 AM  [facility] [address] [place], [place], [address] Phone: [phone]   Fax:  [phone]  Name: Kobie Matkins MRN: [id number] Date of Birth: [birth year]  PHYSICAL THERAPY DISCHARGE SUMMARY  Visits from Start of Care: 2.  Current functional level related to goals / functional outcomes: See above.   Remaining deficits: STG met.   Education / Equipment: HEP. Plan: Patient agrees to discharge.  Patient goals were met. Patient is being discharged due to meeting the stated rehab goals.  ?????         Mali Applegate MPT

## 2015-09-24 ENCOUNTER — Telehealth: Payer: Self-pay | Admitting: Nurse Practitioner

## 2015-09-24 NOTE — Telephone Encounter (Signed)
Patient will schedule an appointment to come see Dr. Wendi Snipes or [facility] for rrepeat wart removal.

## 2016-09-25 IMAGING — CR DG KNEE 1-2V*R*
2 series · 2 of 2 positions shown · non-contrast
Comparison: None.

CLINICAL DATA: 64-year-old female with right knee pain, no known
injury. Initial encounter.

EXAM:
RIGHT KNEE - 1-2 VIEW

[view not recorded (1 of 2)]
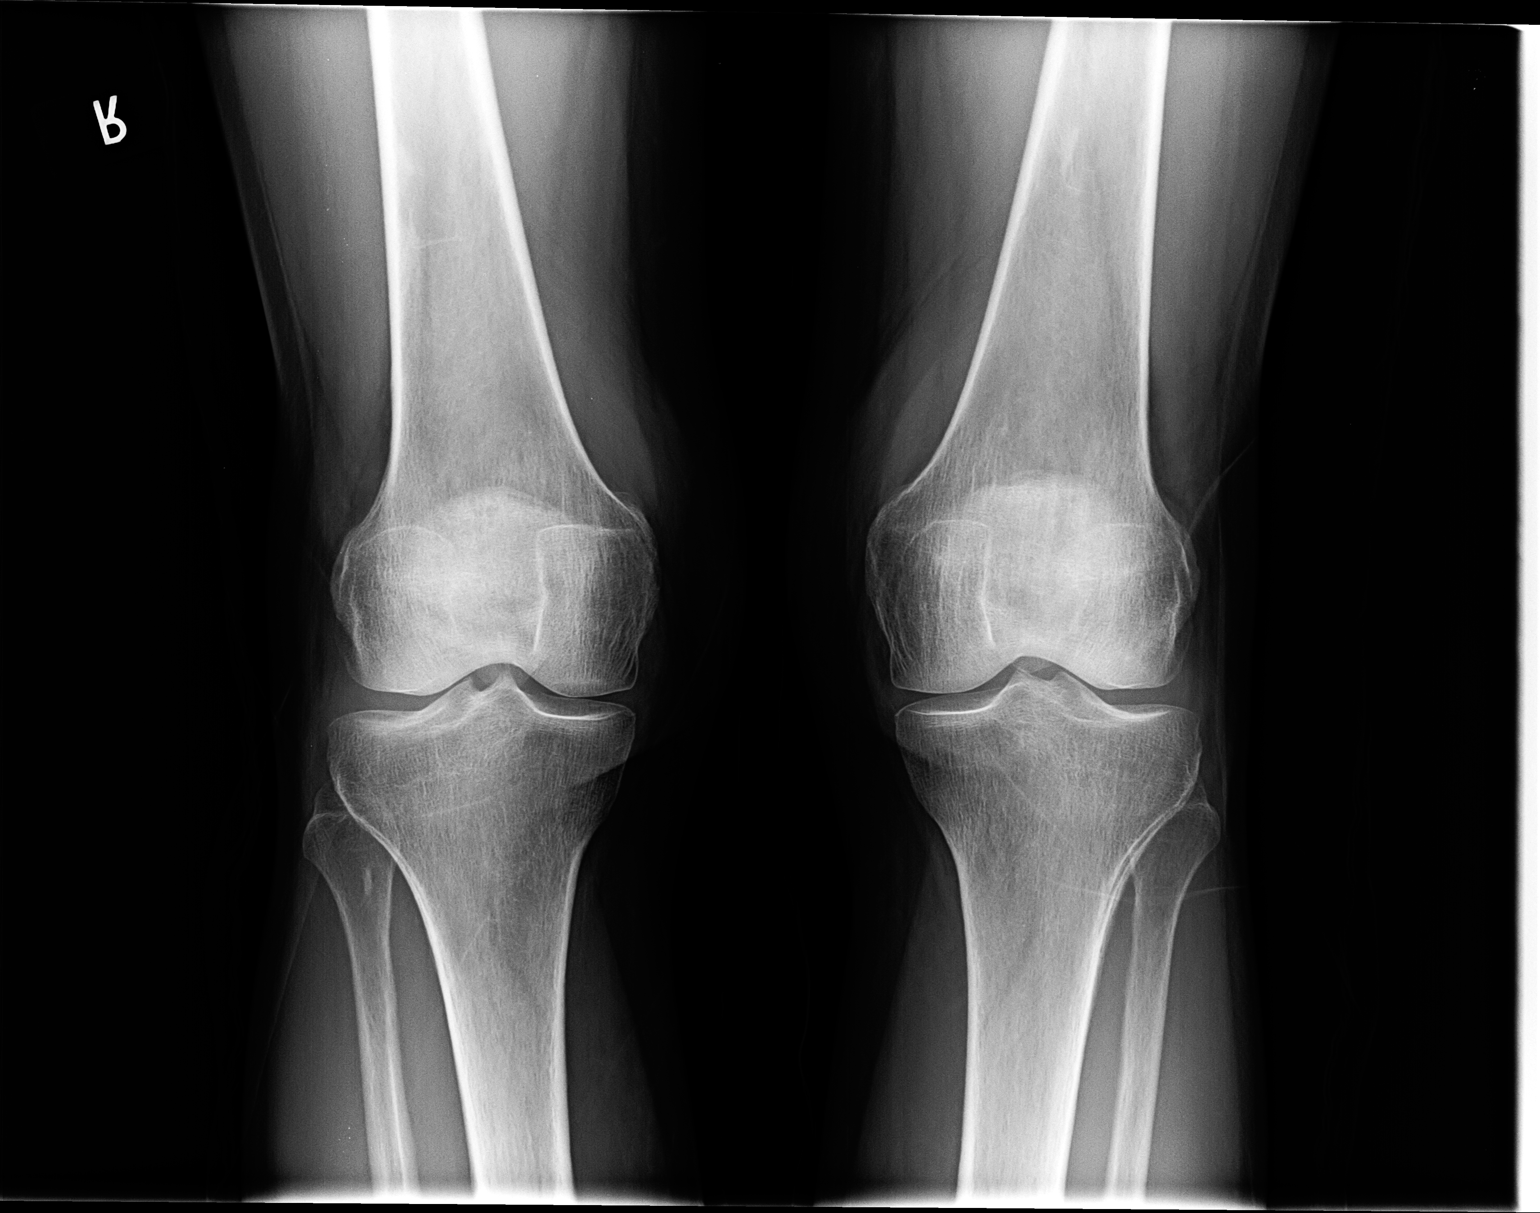

[view not recorded (2 of 2)]
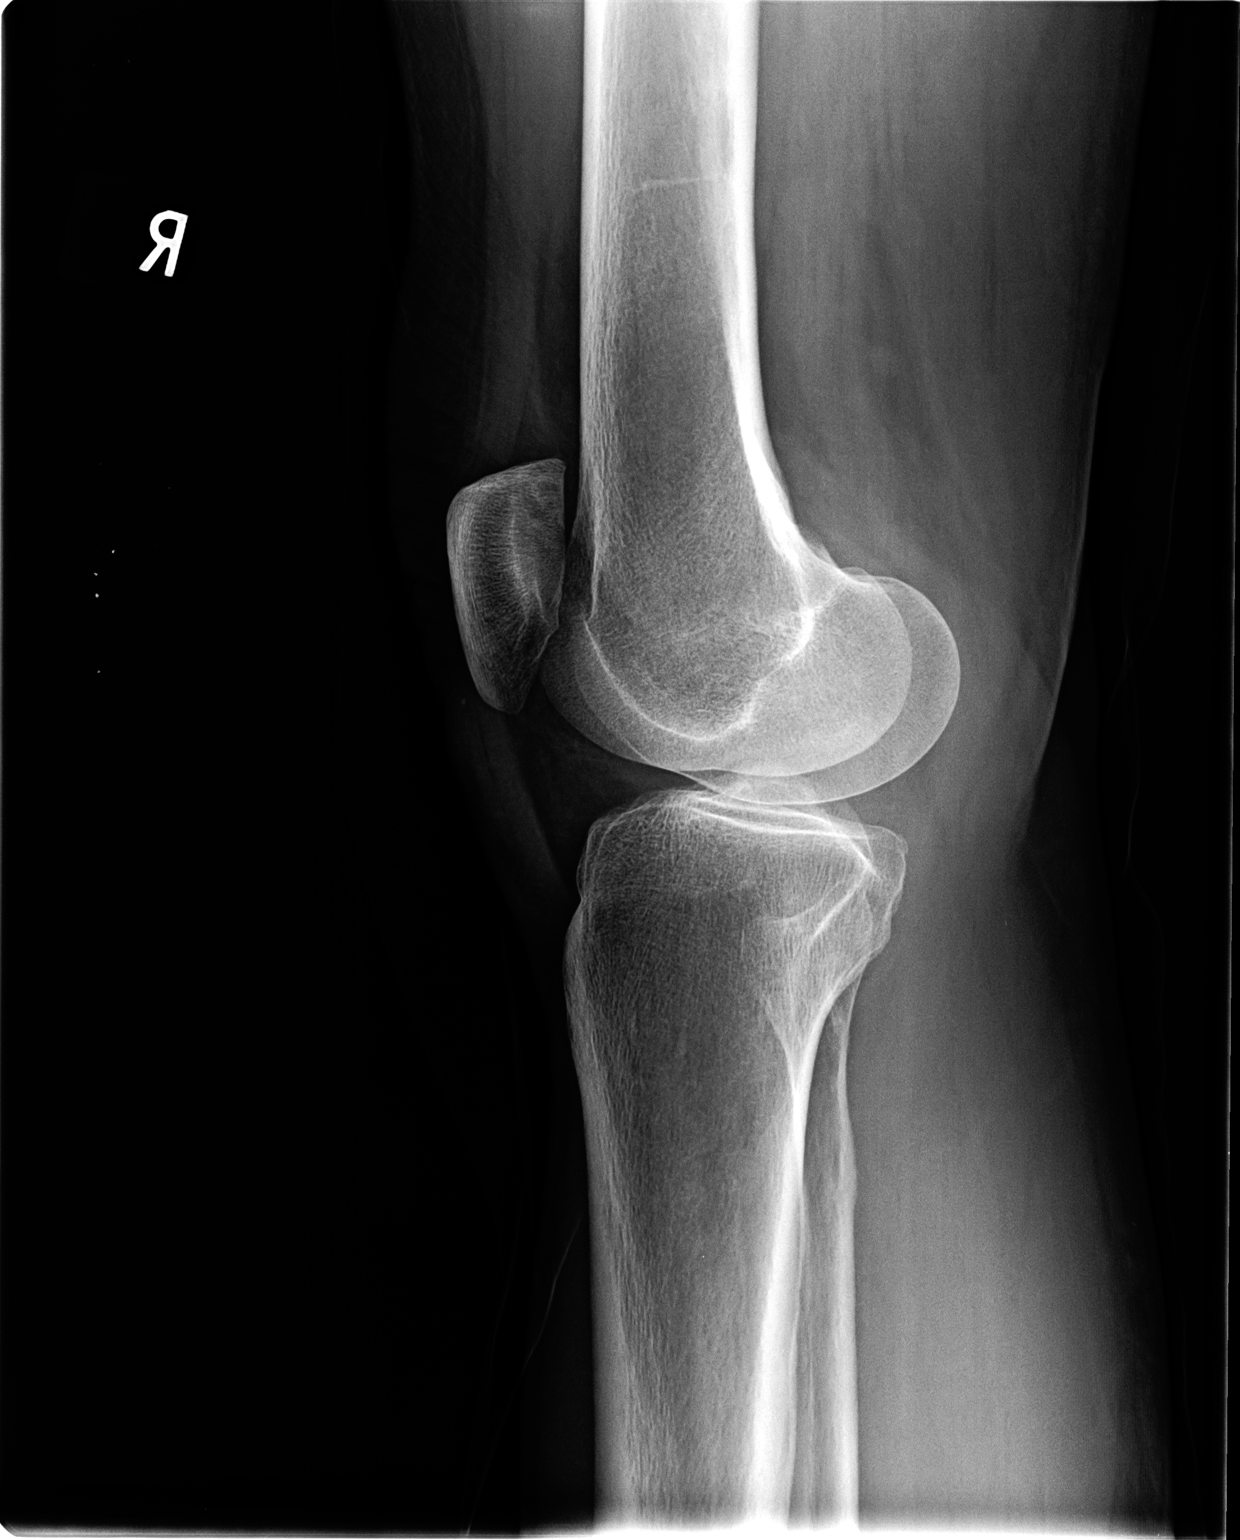

[2 of 2 positions shown; findings below may reference images not displayed]

FINDINGS: AP view also including the left knee. Lateral view. No definite
joint effusion. Bone mineralization is within normal limits. Patella
intact. Joint spaces appear preserved and symmetric bilaterally. No
acute osseous abnormality identified.
IMPRESSION: Negative radiographic appearance of the right knee.

## 2016-11-06 ENCOUNTER — Telehealth: Payer: Self-pay | Admitting: Nurse Practitioner

## 2017-01-26 ENCOUNTER — Encounter: Payer: Self-pay | Admitting: Pediatrics

## 2017-01-26 ENCOUNTER — Ambulatory Visit (INDEPENDENT_AMBULATORY_CARE_PROVIDER_SITE_OTHER): Payer: Commercial Managed Care - HMO | Admitting: Pediatrics

## 2017-01-26 VITALS — BP 130/85 | HR 64 | Temp 97.3°F | Ht 68.0 in | Wt 135.0 lb

## 2017-01-26 DIAGNOSIS — R5383 Other fatigue: Secondary | ICD-10-CM

## 2017-01-26 LAB — URINALYSIS
BILIRUBIN UA: NEGATIVE
GLUCOSE, UA: NEGATIVE
KETONES UA: NEGATIVE
Leukocytes, UA: NEGATIVE
Nitrite, UA: NEGATIVE
PROTEIN UA: NEGATIVE
SPEC GRAV UA: 1.015 (ref 1.005–1.030)
Urobilinogen, Ur: 0.2 mg/dL (ref 0.2–1.0)
pH, UA: 7.5 (ref 5.0–7.5)

## 2017-01-26 NOTE — Progress Notes (Signed)
  Subjective:   Patient ID: Adrienne Jacobs, female    DOB: 1951-04-10, 66 y.o.   MRN: [id number] CC: Fatigue  HPI: Adrienne Jacobs is a 66 y.o. female presenting for Fatigue  Feeling more fatigued when at home When she gets regular exercise she ahs more energy Gets apprx 8 hrs Wakes up some at night Takes melatonin to help her fall asleep Wakes up at night sometimes Takes melatonin to help with sleep  Sometimes snoring at night Felt something in her chest after eating Was at rest Has been working out with weights a lot  Relevant past medical, surgical, family and social history reviewed. Allergies and medications reviewed and updated. History  Smoking Status  . Never Smoker  Smokeless Tobacco  . Never Used   ROS: Per HPI   Objective:    BP 130/85   Pulse 64   Temp (!) 97.3 F (36.3 C) (Oral)   Ht _0  (1.727 m)   Wt 135 lb (61.2 kg)   BMI 20.53 kg/m   Wt Readings from Last 3 Encounters:  01/26/17 135 lb (61.2 kg)  08/02/15 140 lb (63.5 kg)  07/01/15 141 lb (64 kg)    Gen: NAD, alert, cooperative with exam, NCAT EYES: EOMI, no conjunctival injection, or no icterus ENT:  TMs pearly gray b/l, OP without erythema LYMPH: no cervical LAD CV: NRRR, normal S1/S2, no murmur, distal pulses 2+ b/l Resp: CTABL, no wheezes, normal WOB Abd: +BS, soft, NTND. no guarding or organomegaly Ext: No edema, warm Neuro: Alert and oriented, strength equal b/l UE and LE, coordination grossly normal MSK: normal muscle bulk  Assessment & Plan:  Adrienne Jacobs was seen today for fatigue.  Diagnoses and all orders for this visit:  Other fatigue Very active Falls asleep in afternoons more than she used to Will get blood work Continue activity levels -     CMP14+EGFR -     CBC with Differential/Platelet -     TSH -     Urinalysis   Follow up plan: 6 weeks Assunta Found, MD [place]

## 2017-01-27 LAB — CBC WITH DIFFERENTIAL/PLATELET
Basophils Absolute: 0 10*3/uL (ref 0.0–0.2)
Basos: 0 %
EOS (ABSOLUTE): 0.1 10*3/uL (ref 0.0–0.4)
EOS: 2 %
HEMATOCRIT: 37.6 % (ref 34.0–46.6)
HEMOGLOBIN: 12.5 g/dL (ref 11.1–15.9)
IMMATURE GRANS (ABS): 0 10*3/uL (ref 0.0–0.1)
IMMATURE GRANULOCYTES: 0 %
LYMPHS: 32 %
Lymphocytes Absolute: 1.9 10*3/uL (ref 0.7–3.1)
MCH: 29.8 pg (ref 26.6–33.0)
MCHC: 33.2 g/dL (ref 31.5–35.7)
MCV: 90 fL (ref 79–97)
MONOCYTES: 6 %
MONOS ABS: 0.3 10*3/uL (ref 0.1–0.9)
NEUTROS PCT: 60 %
Neutrophils Absolute: 3.5 10*3/uL (ref 1.4–7.0)
Platelets: 218 10*3/uL (ref 150–379)
RBC: 4.19 x10E6/uL (ref 3.77–5.28)
RDW: 13.6 % (ref 12.3–15.4)
WBC: 5.9 10*3/uL (ref 3.4–10.8)

## 2017-01-27 LAB — CMP14+EGFR
A/G RATIO: 2 (ref 1.2–2.2)
ALT: 15 IU/L (ref 0–32)
AST: 21 IU/L (ref 0–40)
Albumin: 4.2 g/dL (ref 3.6–4.8)
Alkaline Phosphatase: 76 IU/L (ref 39–117)
BUN/Creatinine Ratio: 16 (ref 12–28)
BUN: 12 mg/dL (ref 8–27)
CO2: 24 mmol/L (ref 20–29)
Calcium: 9.8 mg/dL (ref 8.7–10.3)
Chloride: 102 mmol/L (ref 96–106)
Creatinine, Ser: 0.73 mg/dL (ref 0.57–1.00)
GFR, EST AFRICAN AMERICAN: 99 mL/min/{1.73_m2} (ref >59)
GFR, EST NON AFRICAN AMERICAN: 85 mL/min/{1.73_m2} (ref >59)
GLOBULIN, TOTAL: 2.1 g/dL (ref 1.5–4.5)
Glucose: 85 mg/dL (ref 65–99)
Potassium: 4.7 mmol/L (ref 3.5–5.2)
Sodium: 140 mmol/L (ref 134–144)
Total Protein: 6.3 g/dL (ref 6.0–8.5)

## 2017-01-27 LAB — TSH: TSH: 3.47 u[IU]/mL (ref 0.450–4.500)

## 2017-03-16 ENCOUNTER — Ambulatory Visit (INDEPENDENT_AMBULATORY_CARE_PROVIDER_SITE_OTHER): Payer: Medicare HMO | Admitting: Pediatrics

## 2017-03-16 ENCOUNTER — Encounter: Payer: Self-pay | Admitting: Pediatrics

## 2017-03-16 VITALS — BP 114/81 | HR 69 | Temp 97.3°F | Ht 68.0 in | Wt 138.0 lb

## 2017-03-16 DIAGNOSIS — Z780 Asymptomatic menopausal state: Secondary | ICD-10-CM

## 2017-03-16 DIAGNOSIS — Z96649 Presence of unspecified artificial hip joint: Secondary | ICD-10-CM

## 2017-03-16 DIAGNOSIS — Z0000 Encounter for general adult medical examination without abnormal findings: Secondary | ICD-10-CM | POA: Diagnosis not present

## 2017-03-16 DIAGNOSIS — E162 Hypoglycemia, unspecified: Secondary | ICD-10-CM

## 2017-03-16 NOTE — Progress Notes (Signed)
  Subjective:   Patient ID: Adrienne Jacobs, female    DOB: [birth year], 66 y.o.   MRN: [id number] CC: Annual Exam  HPI: Adrienne Jacobs is a 66 y.o. female presenting for Annual Exam  Having to eat frequently Ate some cornbread, chickpeas this morning Eats dahl from breakfast, lunch, sometimes with some greek yogurt Vegetables for dinner Doesn't eat meat Tries to get small snacks in Exercises most days a week, swimming, going to the gym or walking Feels better when she regularly exercises  Not sleeping well at night  Some days takes melatonin, benadryl, helps some In bed around 1030 Wakes up around 2am, usually able to go back to sleep if she takes more melatonin Doesn't take every night Has to go to the bathroom once a night Wakes up because she is really hungry  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 Allergies and medications reviewed and updated. History  Smoking Status  . Never Smoker  Smokeless Tobacco  . Never Used   ROS: All systems negative other than what is in HPI  Objective:    BP 114/81   Pulse 69   Temp (!) 97.3 F (36.3 C) (Oral)   Ht 5\' 8"  (1.727 m)   Wt 138 lb (62.6 kg)   BMI 20.98 kg/m   Wt Readings from Last 3 Encounters:  03/16/17 138 lb (62.6 kg)  01/26/17 135 lb (61.2 kg)  08/02/15 140 lb (63.5 kg)    Gen: NAD, alert, cooperative with exam, NCAT EYES: EOMI, no conjunctival injection, or no icterus ENT:  TMs pearly gray b/l, OP without erythema LYMPH: no cervical LAD CV: NRRR, normal S1/S2, no murmur, distal pulses 2+ b/l Resp: CTABL, no wheezes, normal WOB Abd: +BS, soft, NTND. no guarding or organomegaly Ext: No edema, warm Neuro: Alert and oriented, strength equal b/l UE and LE, coordination grossly normal MSK: normal muscle bulk  Assessment & Plan:  Adrienne Jacobs was seen today for annual exam.  Diagnoses and all orders for this visit:  Encounter for preventive health  examination Will rtc for blood work -     Lipid Panel; Future -     Hepatitis C antibody; Future -     Urinalysis, Complete; Future DEXA ordered Declines colonoscopy and FIT test Declines pneumonia shot Will think about flu shot Mammogram due January  Low blood sugar Discussed increasing protein intake during the day Pt very interested in discussing with nutrition -     Amb ref to Medical Nutrition Therapy-MNT  History of hip replacement, unspecified laterality -     Chromium and Cobalt, WB ([facility]); Future   Follow up plan: Return in about 1 year (around 03/16/2018). Assunta Found, MD [place]

## 2017-03-19 ENCOUNTER — Telehealth: Payer: Self-pay | Admitting: Pediatrics

## 2017-03-19 DIAGNOSIS — Z967 Presence of other bone and tendon implants: Secondary | ICD-10-CM

## 2017-03-19 DIAGNOSIS — Z0000 Encounter for general adult medical examination without abnormal findings: Secondary | ICD-10-CM

## 2017-03-21 MED ORDER — AMOXICILLIN 500 MG PO CAPS: 500.0000 mg | ORAL_CAPSULE | Freq: Once | ORAL | 0 refills | Status: AC

## 2017-03-21 NOTE — Telephone Encounter (Signed)
Says has always gotten abx ppx before dental work, will Rx 2g take 30-29min before.

## 2017-03-23 ENCOUNTER — Telehealth: Payer: Self-pay

## 2017-03-23 NOTE — Telephone Encounter (Signed)
Pharmacy faxed to clarify rx for Amoxicillin. They want to know if she should take all 4 capsules at once or 1 dose of a single capsule. Please clarify and send to pharmacy

## 2017-03-23 NOTE — Telephone Encounter (Signed)
2 g amoxicillin 30 to 60 minutes before procedure

## 2017-03-23 NOTE — Telephone Encounter (Signed)
Pharmacist aware of amoxicillin insrtuctions

## 2017-05-01 ENCOUNTER — Telehealth: Payer: Self-pay | Admitting: Pediatrics

## 2017-05-01 NOTE — Telephone Encounter (Signed)
Please advise 

## 2017-05-02 MED ORDER — AMOXICILLIN 500 MG PO CAPS: 2000.0000 mg | ORAL_CAPSULE | Freq: Once | ORAL | 0 refills | Status: AC

## 2017-05-02 NOTE — Telephone Encounter (Signed)
Sent in

## 2017-05-02 NOTE — Telephone Encounter (Signed)
Left message stating that rx for antibiotic has been sent to pharmacy and to call back with any questions or concerns.

## 2017-11-09 ENCOUNTER — Telehealth: Payer: Self-pay | Admitting: Pediatrics

## 2017-11-09 NOTE — Telephone Encounter (Signed)
lmtcb

## 2017-11-14 NOTE — Telephone Encounter (Signed)
Canceled appt.

## 2017-11-19 ENCOUNTER — Ambulatory Visit: Payer: Medicare HMO | Admitting: Pediatrics

## 2017-11-19 ENCOUNTER — Ambulatory Visit (INDEPENDENT_AMBULATORY_CARE_PROVIDER_SITE_OTHER): Payer: Medicare HMO | Admitting: Family

## 2017-11-19 ENCOUNTER — Encounter: Payer: Self-pay | Admitting: Family

## 2017-11-19 VITALS — BP 139/79 | HR 60 | Temp 97.6°F | Ht 68.0 in | Wt 135.4 lb

## 2017-11-19 DIAGNOSIS — W57XXXA Bitten or stung by nonvenomous insect and other nonvenomous arthropods, initial encounter: Secondary | ICD-10-CM

## 2017-11-19 DIAGNOSIS — S30861A Insect bite (nonvenomous) of abdominal wall, initial encounter: Secondary | ICD-10-CM | POA: Diagnosis not present

## 2017-11-19 MED ORDER — DOXYCYCLINE HYCLATE 100 MG PO TABS: 200.0000 mg | ORAL_TABLET | Freq: Once | ORAL | 0 refills | Status: AC

## 2017-11-19 NOTE — Patient Instructions (Signed)

## 2017-11-19 NOTE — Progress Notes (Signed)
   Subjective:    Patient ID: Adrienne Jacobs, female    DOB: Apr 27, 1951, 67 y.o.   MRN: [id number]  Chief Complaint  Patient presents with  . check tick bite    HPI PT presents to the office today for a tick bite that she noticed it three days ago, but the next day noticed a redness around the area. She states she has taken Tumeric and the redness has improved.   Denies any fever, joint pain, headache, or rash.    Review of Systems  All other systems reviewed and are negative.      Objective:   Physical Exam  Constitutional: She is oriented to person, place, and time. She appears well-developed and well-nourished. No distress.  HENT:  Head: Normocephalic and atraumatic.  Right Ear: External ear normal.  Left Ear: External ear normal.  Mouth/Throat: Oropharynx is clear and moist.  Eyes: Pupils are equal, round, and reactive to light.  Neck: Normal range of motion. Neck supple. No thyromegaly present.  Cardiovascular: Normal rate, regular rhythm, normal heart sounds and intact distal pulses.  No murmur heard. Pulmonary/Chest: Effort normal and breath sounds normal. No respiratory distress. She has no wheezes.  Abdominal: Soft. Bowel sounds are normal. She exhibits no distension. There is no tenderness.  Musculoskeletal: Normal range of motion. She exhibits no edema or tenderness.  Neurological: She is alert and oriented to person, place, and time. She has normal reflexes. No cranial nerve deficit.  Skin: Skin is warm and dry. Rash noted.  Erythemas papule of 2.5X1cm on right lower abdomen. No warmth or discharge present  Psychiatric: She has a normal mood and affect. Her behavior is normal. Judgment and thought content normal.  Vitals reviewed.     BP 139/79   Pulse 60   Temp 97.6 F (36.4 C)   Ht 5\' 8"  (1.727 m)   Wt 135 lb 6.4 oz (61.4 kg)   BMI 20.59 kg/m      Assessment & Plan:  Adrienne Jacobs was seen today for check tick bite.  Diagnoses and all orders for this  visit:  Tick bite of abdomen, initial encounter -     doxycycline (VIBRA-TABS) 100 MG tablet; Take 2 tablets (200 mg total) by mouth once for 1 dose.   -Pt to report any new fever, joint pain, or rash -Wear protective clothing while outside- Long sleeves and long pants -Put insect repellent on all exposed skin and along clothing -Take a shower as soon as possible after being outside RTO if symptoms worsen or do not improve and keep follow up with PCP  Adrienne Dun, FNP

## 2018-01-08 ENCOUNTER — Telehealth: Payer: Self-pay | Admitting: Pediatrics

## 2018-02-16 ENCOUNTER — Ambulatory Visit (INDEPENDENT_AMBULATORY_CARE_PROVIDER_SITE_OTHER): Payer: Medicare HMO | Admitting: Family

## 2018-02-16 ENCOUNTER — Encounter: Payer: Self-pay | Admitting: Family

## 2018-02-16 VITALS — BP 105/60 | HR 50 | Temp 97.0°F | Ht 68.0 in | Wt 132.4 lb

## 2018-02-16 DIAGNOSIS — H6123 Impacted cerumen, bilateral: Secondary | ICD-10-CM | POA: Diagnosis not present

## 2018-02-16 NOTE — Progress Notes (Signed)
   Subjective:    Patient ID: Zierra Laroque, female    DOB: [birth year], 67 y.o.   MRN: [id number]  Chief Complaint  Patient presents with  . ears feel stopped up    bilateral- x 1 month - patient states she has been using ear plugs a lot     Ear Fullness   There is pain in both ears. This is a new problem. The current episode started more than 1 month ago. The problem occurs constantly. The problem has been unchanged. There has been no fever. The pain is at a severity of 2/10. The pain is mild. Associated symptoms include hearing loss. Pertinent negatives include no coughing, diarrhea, ear discharge, rhinorrhea, sore throat or vomiting. She has tried nothing for the symptoms. The treatment provided no relief.      Review of Systems  HENT: Positive for hearing loss. Negative for ear discharge, rhinorrhea and sore throat.   Respiratory: Negative for cough.   Gastrointestinal: Negative for diarrhea and vomiting.  All other systems reviewed and are negative.      Objective:   Physical Exam  Constitutional: She is oriented to person, place, and time. She appears well-developed and well-nourished. No distress.  HENT:  Head: Normocephalic and atraumatic.  Mouth/Throat: Oropharynx is clear and moist.  Bilateral cerumen impaction   Eyes: Pupils are equal, round, and reactive to light.  Neck: Normal range of motion. Neck supple. No thyromegaly present.  Cardiovascular: Normal rate, regular rhythm, normal heart sounds and intact distal pulses.  No murmur heard. Pulmonary/Chest: Effort normal and breath sounds normal. No respiratory distress. She has no wheezes.  Abdominal: Soft. Bowel sounds are normal. She exhibits no distension. There is no tenderness.  Musculoskeletal: Normal range of motion. She exhibits no edema or tenderness.  Neurological: She is alert and oriented to person, place, and time. She has normal reflexes. No cranial nerve deficit.  Skin: Skin is warm and dry.    Psychiatric: She has a normal mood and affect. Her behavior is normal. Judgment and thought content normal.  Vitals reviewed.   Bilateral ears irrigated with warm water and peroxide. Cerumen removed. TM WNL  BP 105/60   Pulse (!) 50   Temp (!) 97 F (36.1 C) (Oral)   Ht 5\' 8"  (1.727 m)   Wt 132 lb 6.4 oz (60.1 kg)   BMI 20.13 kg/m      Assessment & Plan:  Jayra Choyce comes in today with chief complaint of ears feel stopped up (bilateral- x 1 month - patient states she has been using ear plugs a lot )   Diagnosis and orders addressed:  1. Bilateral impacted cerumen Do not stick anything into ears OTC ear drops as needed RTO if symptoms worsen or do not improve    [name], FNP

## 2018-02-16 NOTE — Patient Instructions (Signed)
Earwax Buildup, Adult The ears produce a substance called earwax that helps keep bacteria out of the ear and protects the skin in the ear canal. Occasionally, earwax can build up in the ear and cause discomfort or hearing loss. What increases the risk? This condition is more likely to develop in people who:  Are female.  Are elderly.  Naturally produce more earwax.  Clean their ears often with cotton swabs.  Use earplugs often.  Use in-ear headphones often.  Wear hearing aids.  Have narrow ear canals.  Have earwax that is overly thick or sticky.  Have eczema.  Are dehydrated.  Have excess hair in the ear canal.  What are the signs or symptoms? Symptoms of this condition include:  Reduced or muffled hearing.  A feeling of fullness in the ear or feeling that the ear is plugged.  Fluid coming from the ear.  Ear pain.  Ear itch.  Ringing in the ear.  Coughing.  An obvious piece of earwax that can be seen inside the ear canal.  How is this diagnosed? This condition may be diagnosed based on:  Your symptoms.  Your medical history.  An ear exam. During the exam, your health care provider will look into your ear with an instrument called an otoscope.  You may have tests, including a hearing test. How is this treated? This condition may be treated by:  Using ear drops to soften the earwax.  Having the earwax removed by a health care provider. The health care provider may: ? Flush the ear with water. ? Use an instrument that has a loop on the end (curette). ? Use a suction device.  Surgery to remove the wax buildup. This may be done in severe cases.  Follow these instructions at home:  Take over-the-counter and prescription medicines only as told by your health care provider.  Do not put any objects, including cotton swabs, into your ear. You can clean the opening of your ear canal with a washcloth or facial tissue.  Follow instructions from your health  care provider about cleaning your ears. Do not over-clean your ears.  Drink enough fluid to keep your urine clear or pale yellow. This will help to thin the earwax.  Keep all follow-up visits as told by your health care provider. If earwax builds up in your ears often or if you use hearing aids, consider seeing your health care provider for routine, preventive ear cleanings. Ask your health care provider how often you should schedule your cleanings.  If you have hearing aids, clean them according to instructions from the manufacturer and your health care provider. Contact a health care provider if:  You have ear pain.  You develop a fever.  You have blood, pus, or other fluid coming from your ear.  You have hearing loss.  You have ringing in your ears that does not go away.  Your symptoms do not improve with treatment.  You feel like the room is spinning (vertigo). Summary  Earwax can build up in the ear and cause discomfort or hearing loss.  The most common symptoms of this condition include reduced or muffled hearing and a feeling of fullness in the ear or feeling that the ear is plugged.  This condition may be diagnosed based on your symptoms, your medical history, and an ear exam.  This condition may be treated by using ear drops to soften the earwax or by having the earwax removed by a health care provider.  Do   not put any objects, including cotton swabs, into your ear. You can clean the opening of your ear canal with a washcloth or facial tissue. This information is not intended to replace advice given to you by your health care provider. Make sure you discuss any questions you have with your health care provider. Document Released: 06/29/2004 Document Revised: 08/02/2016 Document Reviewed: 08/02/2016 Elsevier Interactive Patient Education  2018 [employer]  

## 2018-02-18 ENCOUNTER — Ambulatory Visit (INDEPENDENT_AMBULATORY_CARE_PROVIDER_SITE_OTHER): Payer: Medicare HMO | Admitting: Family Medicine

## 2018-02-18 ENCOUNTER — Encounter: Payer: Self-pay | Admitting: Family Medicine

## 2018-02-18 ENCOUNTER — Telehealth: Payer: Self-pay

## 2018-02-18 VITALS — BP 130/74 | HR 57 | Temp 97.4°F | Ht 68.0 in | Wt 133.0 lb

## 2018-02-18 DIAGNOSIS — H6122 Impacted cerumen, left ear: Secondary | ICD-10-CM | POA: Diagnosis not present

## 2018-02-18 DIAGNOSIS — H60392 Other infective otitis externa, left ear: Secondary | ICD-10-CM | POA: Diagnosis not present

## 2018-02-18 MED ORDER — CIPROFLOXACIN-DEXAMETHASONE 0.3-0.1 % OT SUSP: 4.0000 [drp] | Freq: Two times a day (BID) | OTIC | 0 refills | Status: DC

## 2018-02-18 MED ORDER — NEOMYCIN-POLYMYXIN-HC 3.5-10000-1 OT SOLN: 4.0000 [drp] | Freq: Four times a day (QID) | OTIC | 0 refills | Status: AC

## 2018-02-18 MED ORDER — CIPROFLOXACIN-FLUOCINOLONE PF 0.3-0.025 % OT SOLN: 0.2500 mL | Freq: Two times a day (BID) | OTIC | Status: DC

## 2018-02-18 NOTE — Addendum Note (Signed)
Addended by: Baruch Gouty on: 02/18/2018 11:31 AM   Modules accepted: Orders

## 2018-02-18 NOTE — Telephone Encounter (Signed)
Sent in Cortisporin otic. 4 drops three times per day for 7 days

## 2018-02-18 NOTE — Progress Notes (Addendum)
Subjective:    Patient ID: Adrienne Jacobs, female    DOB: 07-Jul-1950, 67 y.o.   MRN: [id number]  Chief Complaint:  Left ear blocked (seen on Saturday, unable to get wax out of ear, has used drops over weekend and would like to try to remove wax now)   HPI: [name] is a 67 y.o. female presenting on 02/18/2018 for Left ear blocked (seen on Saturday, unable to get wax out of ear, has used drops over weekend and would like to try to remove wax now)   1. Impacted cerumen of left ear  Pt was seen on Saturday for cerumen impaction of bilateral ears. Pt states they were able to clean the right ear, but unable to get the cerumen out of the left ear. Pt states she has been using Debrox drops since Saturday and is here to get her left ear cleaned. Pt states she has decreased hearing in the left ear. Denies fever, chills, or pain.   2. Other infective acute otitis externa of left ear      Relevant past medical, surgical, family and social history reviewed and updated as indicated. Interim medical history since our last visit reviewed. Allergies and medications reviewed and updated. DATA REVIEWED: CHART IN EPIC  Family History reviewed for pertinent findings.  Past Medical History:  Diagnosis Date  . Arthritis   . Osteopenia   . Osteopenia     Past Surgical History:  Procedure Laterality Date  . JOINT REPLACEMENT    . TOTAL HIP ARTHROPLASTY    . TOTAL HIP ARTHROPLASTY      Social History   Socioeconomic History  . Marital status: Married    Spouse name: Not on file  . Number of children: Not on file  . Years of education: Not on file  . Highest education level: Not on file  Occupational History  . Not on file  Social Needs  . Financial resource strain: Not on file  . Food insecurity:    Worry: Not on file    Inability: Not on file  . Transportation needs:    Medical: Not on file    Non-medical: Not on file  Tobacco Use  . Smoking status: Never Smoker  .  Smokeless tobacco: Never Used  Substance and Sexual Activity  . Alcohol use: No  . Drug use: No  . Sexual activity: Not on file  Lifestyle  . Physical activity:    Days per week: Not on file    Minutes per session: Not on file  . Stress: Not on file  Relationships  . Social connections:    Talks on phone: Not on file    Gets together: Not on file    Attends religious service: Not on file    Active member of club or organization: Not on file    Attends meetings of clubs or organizations: Not on file    Relationship status: Not on file  . Intimate partner violence:    Fear of current or ex partner: Not on file    Emotionally abused: Not on file    Physically abused: Not on file    Forced sexual activity: Not on file  Other Topics Concern  . Not on file  Social History Narrative  . Not on file    Outpatient Encounter Medications as of 02/18/2018  Medication Sig  . diphenhydrAMINE HCl (BENADRYL PO) Take by mouth.  . Melatonin 5 MG CAPS Take 5 mg by mouth at  bedtime as needed.  . ciprofloxacin-dexamethasone (CIPRODEX) OTIC suspension Place 4 drops into the left ear 2 (two) times daily for 7 days.   No facility-administered encounter medications on file as of 02/18/2018.     No Known Allergies  Review of Systems  Constitutional: Negative for chills and fever.  HENT: Positive for ear pain, hearing loss (Decreased hearing in left ear) and tinnitus.   Respiratory: Negative for cough and shortness of breath.   Neurological: Negative for weakness.  All other systems reviewed and are negative.       Objective:    BP 130/74   Pulse (!) 57   Temp (!) 97.4 F (36.3 C) (Oral)   Ht 5\' 8"  (1.727 m)   Wt 133 lb (60.3 kg)   BMI 20.22 kg/m    Wt Readings from Last 3 Encounters:  02/18/18 133 lb (60.3 kg)  02/16/18 132 lb 6.4 oz (60.1 kg)  11/19/17 135 lb 6.4 oz (61.4 kg)    Physical Exam  Constitutional: She is oriented to person, place, and time. She appears  well-developed and well-nourished.  HENT:  Head: Normocephalic and atraumatic.  Right Ear: Hearing, tympanic membrane, external ear and ear canal normal.  Left Ear: Decreased hearing is noted.  Left cerumen impaction. Rinne: right AC>BC, left BC>AC  Eyes: Pupils are equal, round, and reactive to light.  Cardiovascular: Normal rate, regular rhythm and normal heart sounds.  Pulmonary/Chest: Effort normal and breath sounds normal.  Neurological: She is alert and oriented to person, place, and time.  Skin: Skin is warm and dry. Capillary refill takes less than 2 seconds.  Psychiatric: She has a normal mood and affect. Her behavior is normal. Judgment and thought content normal.  Nursing note and vitals reviewed.  Left ear cerumen impaction removal with irrigation (warm water and peroxide) and ear curette and alligator forceps. Left TM normal after cerumen removal. Left external canal erythematous and slightly swollen after cerumen removal. Left Rinne AC>BC after cerumen removal.     Assessment & Plan:   [name] comes in today with chief complaint of Left ear blocked (seen on Saturday, unable to get wax out of ear, has used drops over weekend and would like to try to remove wax now)   1. Impacted cerumen of left ear Removal of cerumen impaction in office.   2. Other infective acute otitis externa of left ear After cerumen impaction removal, erythema and slight swelling noted to left external ear canal.  - ciprofloxacin-dexamethasone (CIPRODEX) OTIC suspension; Place 4 drops into the left ear 2 (two) times daily for 7 days.  Dispense: 7.5 mL; Refill: 0   Continue all other maintenance medications.  Pharmacy called, they do not have Ciprodex in stock. Cortisporin otic sent to pharmacy. Patient aware.   Follow up plan: Return in about 2 weeks (around 03/04/2018), or if symptoms worsen or fail to improve.  Educational handout given for earwax buildup, otitis externa  The above  assessment and management plan was discussed with the patient. The patient verbalized understanding of and has agreed to the management plan. Patient is aware to call the clinic if symptoms persist or worsen. Patient is aware when to return to the clinic for a follow-up visit. Patient educated on when it is appropriate to go to the emergency department.   Monia Pouch, FNP-C [facility] [phone]

## 2018-02-18 NOTE — Addendum Note (Signed)
Addended by: Baruch Gouty on: 02/18/2018 10:53 AM   Modules accepted: Orders

## 2018-02-18 NOTE — Patient Instructions (Addendum)
No Q-Tips in ears. Avoid earplug use.  Debrox drops at night.    Earwax Buildup, Adult The ears produce a substance called earwax that helps keep bacteria out of the ear and protects the skin in the ear canal. Occasionally, earwax can build up in the ear and cause discomfort or hearing loss. What increases the risk? This condition is more likely to develop in people who:  Are female.  Are elderly.  Naturally produce more earwax.  Clean their ears often with cotton swabs.  Use earplugs often.  Use in-ear headphones often.  Wear hearing aids.  Have narrow ear canals.  Have earwax that is overly thick or sticky.  Have eczema.  Are dehydrated.  Have excess hair in the ear canal.  What are the signs or symptoms? Symptoms of this condition include:  Reduced or muffled hearing.  A feeling of fullness in the ear or feeling that the ear is plugged.  Fluid coming from the ear.  Ear pain.  Ear itch.  Ringing in the ear.  Coughing.  An obvious piece of earwax that can be seen inside the ear canal.  How is this diagnosed? This condition may be diagnosed based on:  Your symptoms.  Your medical history.  An ear exam. During the exam, your health care provider will look into your ear with an instrument called an otoscope.  You may have tests, including a hearing test. How is this treated? This condition may be treated by:  Using ear drops to soften the earwax.  Having the earwax removed by a health care provider. The health care provider may: ? Flush the ear with water. ? Use an instrument that has a loop on the end (curette). ? Use a suction device.  Surgery to remove the wax buildup. This may be done in severe cases.  Follow these instructions at home:  Take over-the-counter and prescription medicines only as told by your health care provider.  Do not put any objects, including cotton swabs, into your ear. You can clean the opening of your ear canal with a  washcloth or facial tissue.  Follow instructions from your health care provider about cleaning your ears. Do not over-clean your ears.  Drink enough fluid to keep your urine clear or pale yellow. This will help to thin the earwax.  Keep all follow-up visits as told by your health care provider. If earwax builds up in your ears often or if you use hearing aids, consider seeing your health care provider for routine, preventive ear cleanings. Ask your health care provider how often you should schedule your cleanings.  If you have hearing aids, clean them according to instructions from the manufacturer and your health care provider. Contact a health care provider if:  You have ear pain.  You develop a fever.  You have blood, pus, or other fluid coming from your ear.  You have hearing loss.  You have ringing in your ears that does not go away.  Your symptoms do not improve with treatment.  You feel like the room is spinning (vertigo). Summary  Earwax can build up in the ear and cause discomfort or hearing loss.  The most common symptoms of this condition include reduced or muffled hearing and a feeling of fullness in the ear or feeling that the ear is plugged.  This condition may be diagnosed based on your symptoms, your medical history, and an ear exam.  This condition may be treated by using ear drops to soften  the earwax or by having the earwax removed by a health care provider.  Do not put any objects, including cotton swabs, into your ear. You can clean the opening of your ear canal with a washcloth or facial tissue. This information is not intended to replace advice given to you by your health care provider. Make sure you discuss any questions you have with your health care provider. Document Released: 06/29/2004 Document Revised: 08/02/2016 Document Reviewed: 08/02/2016 Elsevier Interactive Patient Education  2018 [employer].  Otitis Externa Otitis externa is an infection  of the outer ear canal. The outer ear canal is the area between the outside of the ear and the eardrum. Otitis externa is sometimes called "swimmer's ear." Follow these instructions at home:  If you were given antibiotic ear drops, use them as told by your doctor. Do not stop using them even if your condition gets better.  Take over-the-counter and prescription medicines only as told by your doctor.  Keep all follow-up visits as told by your doctor. This is important. How is this prevented?  Keep your ear dry. Use the corner of a towel to dry your ear after you swim or bathe.  Try not to scratch or put things in your ear. Doing these things makes it easier for germs to grow in your ear.  Avoid swimming in lakes, dirty water, or pools that may not have the right amount of a chemical called chlorine.  Consider making ear drops and putting 3 or 4 drops in each ear after you swim. Ask your doctor about how you can make ear drops. Contact a doctor if:  You have a fever.  After 3 days your ear is still red, swollen, or painful.  After 3 days you still have pus coming from your ear.  Your redness, swelling, or pain gets worse.  You have a really bad headache.  You have redness, swelling, pain, or tenderness behind your ear. This information is not intended to replace advice given to you by your health care provider. Make sure you discuss any questions you have with your health care provider. Document Released: 11/08/2007 Document Revised: 06/17/2015 Document Reviewed: 03/01/2015 Elsevier Interactive Patient Education  [employer].

## 2018-02-18 NOTE — Telephone Encounter (Signed)
[facility] called about patient's prescription for Ciprodex.  This medication will cost her $200 and they do not have in stock right now.  Would like to know if you will send in a prescription for something different.

## 2018-03-04 ENCOUNTER — Telehealth: Payer: Self-pay | Admitting: Pediatrics

## 2018-03-04 NOTE — Telephone Encounter (Signed)
Attempted to contact patient to make sooner appt

## 2018-03-05 ENCOUNTER — Ambulatory Visit: Payer: Medicare HMO | Admitting: Family Medicine

## 2018-06-26 ENCOUNTER — Ambulatory Visit: Payer: Medicare HMO | Admitting: Pediatrics

## 2018-07-30 ENCOUNTER — Telehealth: Payer: Self-pay | Admitting: General Practice

## 2019-02-05 ENCOUNTER — Other Ambulatory Visit: Payer: Self-pay

## 2019-02-06 ENCOUNTER — Other Ambulatory Visit: Payer: Self-pay

## 2019-02-06 ENCOUNTER — Telehealth: Payer: Self-pay | Admitting: Family

## 2019-02-06 ENCOUNTER — Encounter: Payer: Self-pay | Admitting: Family

## 2019-02-06 ENCOUNTER — Ambulatory Visit (INDEPENDENT_AMBULATORY_CARE_PROVIDER_SITE_OTHER): Payer: Medicare HMO | Admitting: Family

## 2019-02-06 VITALS — BP 103/75 | HR 75 | Temp 97.8°F | Ht 68.0 in | Wt 131.2 lb

## 2019-02-06 DIAGNOSIS — L821 Other seborrheic keratosis: Secondary | ICD-10-CM | POA: Diagnosis not present

## 2019-02-06 DIAGNOSIS — L918 Other hypertrophic disorders of the skin: Secondary | ICD-10-CM

## 2019-02-06 NOTE — Telephone Encounter (Signed)
Patient informed to use cool wash cloths or ice packs covered with towel on skin.

## 2019-02-06 NOTE — Progress Notes (Addendum)
   Subjective:    Patient ID: Adrienne Jacobs, female    DOB: April 24, 1951, 68 y.o.   MRN: [id number]  Chief Complaint  Patient presents with  . remove skin tag and check itching bumps    having headaches and dizzy spells    HPI Pt presents to the office today with skin tag under her right breast that has been there years, but she states she "started picking at it" and then started bleeding and turned black. She states she has small irration when it rubs her bra of a 2-3 out 10.   She also reports skin lesions that are waxy along her bra line and left shoulder.    Review of Systems  All other systems reviewed and are negative.      Objective:   Physical Exam Vitals signs reviewed.  Constitutional:      General: She is not in acute distress.    Appearance: She is well-developed.  HENT:     Head: Normocephalic and atraumatic.  Eyes:     Pupils: Pupils are equal, round, and reactive to light.  Neck:     Musculoskeletal: Normal range of motion and neck supple.     Thyroid: No thyromegaly.  Cardiovascular:     Rate and Rhythm: Normal rate and regular rhythm.     Heart sounds: Normal heart sounds. No murmur.  Pulmonary:     Effort: Pulmonary effort is normal. No respiratory distress.     Breath sounds: Normal breath sounds. No wheezing.  Abdominal:     General: Bowel sounds are normal. There is no distension.     Palpations: Abdomen is soft.     Tenderness: There is no abdominal tenderness.  Musculoskeletal: Normal range of motion.        General: No tenderness.  Skin:    General: Skin is warm and dry.     Comments: Skin tag present under right breast, multiple seborrheic keratosis present on abdomen, back, shoulders, and face  Neurological:     Mental Status: She is alert and oriented to person, place, and time.     Cranial Nerves: No cranial nerve deficit.     Deep Tendon Reflexes: Reflexes are normal and symmetric.  Psychiatric:        Behavior: Behavior normal.      Thought Content: Thought content normal.        Judgment: Judgment normal.     Skin area cleaned and skin tag under right breast removed. Pt tolerated well.  Cryotherapy used on 10 Seborrheic keratosis, pt tolerated well.      BP 103/75   Pulse 75   Temp 97.8 F (36.6 C) (Oral)   Ht 5\' 8"  (1.727 m)   Wt 131 lb 3.2 oz (59.5 kg)   BMI 19.95 kg/m   Assessment & Plan:  1. Skin tag  2. Seborrheic keratosis  Do not pick at areas May blister Avoid swimming for next 2 days Report any s/s of infection She will make CPE for lab work  [name], [place]

## 2019-02-06 NOTE — Patient Instructions (Signed)
Seborrheic Keratosis A seborrheic keratosis is a common, noncancerous (benign) skin growth. These growths are velvety, waxy, rough, tan, brown, or black spots that appear on the skin. These skin growths can be flat or raised, and scaly. What are the causes? The cause of this condition is not known. What increases the risk? You are more likely to develop this condition if you:  Have a family history of seborrheic keratosis.  Are 50 or older.  Are pregnant.  Have had estrogen replacement therapy. What are the signs or symptoms? Symptoms of this condition include growths on the face, chest, shoulders, back, or other areas. These growths:  Are usually painless, but may become irritated and itchy.  Can be yellow, brown, black, or other colors.  Are slightly raised or have a flat surface.  Are sometimes rough or wart-like in texture.  Are often velvety or waxy on the surface.  Are round or oval-shaped.  Often occur in groups, but may occur as a single growth. How is this diagnosed? This condition is diagnosed with a medical history and physical exam.  A sample of the growth may be tested (skin biopsy).  You may need to see a skin specialist (dermatologist). How is this treated? Treatment is not usually needed for this condition, unless the growths are irritated or bleed often.  You may also choose to have the growths removed if you do not like their appearance. ? Most commonly, these growths are treated with a procedure in which liquid nitrogen is applied to "freeze" off the growth (cryosurgery). ? They may also be burned off with electricity (electrocautery) or removed by scraping (curettage). Follow these instructions at home:  Watch your growth for any changes.  Keep all follow-up visits as told by your health care provider. This is important.  Do not scratch or pick at the growth or growths. This can cause them to become irritated or infected. Contact a health care  provider if:  You suddenly have many new growths.  Your growth bleeds, itches, or hurts.  Your growth suddenly becomes larger or changes color. Summary  A seborrheic keratosis is a common, noncancerous (benign) skin growth.  Treatment is not usually needed for this condition, unless the growths are irritated or bleed often.  Watch your growth for any changes.  Contact a health care provider if you suddenly have many new growths or your growth suddenly becomes larger or changes color.  Keep all follow-up visits as told by your health care provider. This is important. This information is not intended to replace advice given to you by your health care provider. Make sure you discuss any questions you have with your health care provider. Document Released: 06/24/2010 Document Revised: 10/04/2017 Document Reviewed: 10/04/2017 Elsevier Patient Education  2020 Elsevier Inc.  

## 2019-03-06 ENCOUNTER — Ambulatory Visit (INDEPENDENT_AMBULATORY_CARE_PROVIDER_SITE_OTHER): Payer: Medicare HMO | Admitting: Family

## 2019-03-06 ENCOUNTER — Encounter: Payer: Self-pay | Admitting: Family

## 2019-03-06 ENCOUNTER — Other Ambulatory Visit: Payer: Self-pay

## 2019-03-06 VITALS — BP 152/82 | HR 97 | Temp 97.8°F | Ht 68.0 in | Wt 134.4 lb

## 2019-03-06 DIAGNOSIS — R42 Dizziness and giddiness: Secondary | ICD-10-CM | POA: Diagnosis not present

## 2019-03-06 DIAGNOSIS — Z96649 Presence of unspecified artificial hip joint: Secondary | ICD-10-CM | POA: Diagnosis not present

## 2019-03-06 DIAGNOSIS — Z0001 Encounter for general adult medical examination with abnormal findings: Secondary | ICD-10-CM

## 2019-03-06 DIAGNOSIS — Z1159 Encounter for screening for other viral diseases: Secondary | ICD-10-CM | POA: Diagnosis not present

## 2019-03-06 DIAGNOSIS — H9193 Unspecified hearing loss, bilateral: Secondary | ICD-10-CM | POA: Diagnosis not present

## 2019-03-06 DIAGNOSIS — Z23 Encounter for immunization: Secondary | ICD-10-CM | POA: Diagnosis not present

## 2019-03-06 DIAGNOSIS — R519 Headache, unspecified: Secondary | ICD-10-CM

## 2019-03-06 DIAGNOSIS — Z0000 Encounter for general adult medical examination without abnormal findings: Secondary | ICD-10-CM | POA: Diagnosis not present

## 2019-03-06 DIAGNOSIS — Z136 Encounter for screening for cardiovascular disorders: Secondary | ICD-10-CM | POA: Diagnosis not present

## 2019-03-06 DIAGNOSIS — R030 Elevated blood-pressure reading, without diagnosis of hypertension: Secondary | ICD-10-CM

## 2019-03-06 NOTE — Patient Instructions (Signed)
Dizziness Dizziness is a common problem. It is a feeling of unsteadiness or light-headedness. You may feel like you are about to faint. Dizziness can lead to injury if you stumble or fall. Anyone can become dizzy, but dizziness is more common in older adults. This condition can be caused by a number of things, including medicines, dehydration, or illness. Follow these instructions at home: Eating and drinking  Drink enough fluid to keep your urine clear or pale yellow. This helps to keep you from becoming dehydrated. Try to drink more clear fluids, such as water.  Do not drink alcohol.  Limit your caffeine intake if told to do so by your health care provider. Check ingredients and nutrition facts to see if a food or beverage contains caffeine.  Limit your salt (sodium) intake if told to do so by your health care provider. Check ingredients and nutrition facts to see if a food or beverage contains sodium. Activity  Avoid making quick movements. ? Rise slowly from chairs and steady yourself until you feel okay. ? In the morning, first sit up on the side of the bed. When you feel okay, stand slowly while you hold onto something until you know that your balance is fine.  If you need to stand in one place for a long time, move your legs often. Tighten and relax the muscles in your legs while you are standing.  Do not drive or use heavy machinery if you feel dizzy.  Avoid bending down if you feel dizzy. Place items in your home so that they are easy for you to reach without leaning over. Lifestyle  Do not use any products that contain nicotine or tobacco, such as cigarettes and e-cigarettes. If you need help quitting, ask your health care provider.  Try to reduce your stress level by using methods such as yoga or meditation. Talk with your health care provider if you need help to manage your stress. General instructions  Watch your dizziness for any changes.  Take over-the-counter and  prescription medicines only as told by your health care provider. Talk with your health care provider if you think that your dizziness is caused by a medicine that you are taking.  Tell a friend or a family member that you are feeling dizzy. If he or she notices any changes in your behavior, have this person call your health care provider.  Keep all follow-up visits as told by your health care provider. This is important. Contact a health care provider if:  Your dizziness does not go away.  Your dizziness or light-headedness gets worse.  You feel nauseous.  You have reduced hearing.  You have new symptoms.  You are unsteady on your feet or you feel like the room is spinning. Get help right away if:  You vomit or have diarrhea and are unable to eat or drink anything.  You have problems talking, walking, swallowing, or using your arms, hands, or legs.  You feel generally weak.  You are not thinking clearly or you have trouble forming sentences. It may take a friend or family member to notice this.  You have chest pain, abdominal pain, shortness of breath, or sweating.  Your vision changes.  You have any bleeding.  You have a severe headache.  You have neck pain or a stiff neck.  You have a fever. These symptoms may represent a serious problem that is an emergency. Do not wait to see if the symptoms will go away. Get medical help   right away. Call your local emergency services (911 in the [place] Do not drive yourself to the hospital. Summary  Dizziness is a feeling of unsteadiness or light-headedness. This condition can be caused by a number of things, including medicines, dehydration, or illness.  Anyone can become dizzy, but dizziness is more common in older adults.  Drink enough fluid to keep your urine clear or pale yellow. Do not drink alcohol.  Avoid making quick movements if you feel dizzy. Monitor your dizziness for any changes. This information is not intended to  replace advice given to you by your health care provider. Make sure you discuss any questions you have with your health care provider. Document Released: 11/15/2000 Document Revised: 05/25/2017 Document Reviewed: 06/24/2016 Elsevier Patient Education  2020 Elsevier Inc.  

## 2019-03-06 NOTE — Progress Notes (Signed)
Subjective:    Patient ID: Adrienne Jacobs, female    DOB: Aug 15, 1950, 68 y.o.   MRN: [id number]  Chief Complaint  Patient presents with  . Annual Exam   Pt presents to the office today for CPE without pap. PT currently taking OTC medications, but any prescriptions medications at this time. Pt denies any headache, palpitations, SOB, or edema at this time.  She reports she has noticed a delay in her cognitive ability recently over the last 6 months. She is requesting a referral to a neurologists today.   Insomnia Primary symptoms: difficulty falling asleep, frequent awakening.  The current episode started more than one year. The onset quality is gradual. The problem occurs intermittently. The problem has been waxing and waning since onset.  Migraine  This is a recurrent problem. The current episode started more than 1 year ago. The problem occurs seasonly. The problem has been waxing and waning. The pain is located in the occipital region. The pain quality is similar to prior headaches. The pain is moderate. Associated symptoms include dizziness, insomnia and photophobia. Pertinent negatives include no phonophobia.  Dizziness This is a recurrent problem. The current episode started more than 1 year ago. The problem occurs intermittently. The problem has been waxing and waning. She has tried rest for the symptoms. The treatment provided mild relief.      Review of Systems  Eyes: Positive for photophobia.  Neurological: Positive for dizziness.  Psychiatric/Behavioral: The patient has insomnia.   All other systems reviewed and are negative.  History reviewed. No pertinent family history. Social History   Socioeconomic History  . Marital status: Married    Spouse name: Not on file  . Number of children: Not on file  . Years of education: Not on file  . Highest education level: Not on file  Occupational History  . Not on file  Social Needs  . Financial resource strain: Not on file   . Food insecurity    Worry: Not on file    Inability: Not on file  . Transportation needs    Medical: Not on file    Non-medical: Not on file  Tobacco Use  . Smoking status: Never Smoker  . Smokeless tobacco: Never Used  Substance and Sexual Activity  . Alcohol use: No  . Drug use: No  . Sexual activity: Not on file  Lifestyle  . Physical activity    Days per week: Not on file    Minutes per session: Not on file  . Stress: Not on file  Relationships  . Social Herbalist on phone: Not on file    Gets together: Not on file    Attends religious service: Not on file    Active member of club or organization: Not on file    Attends meetings of clubs or organizations: Not on file    Relationship status: Not on file  Other Topics Concern  . Not on file  Social History Narrative  . Not on file       Objective:   Physical Exam Vitals signs reviewed.  Constitutional:      General: She is not in acute distress.    Appearance: She is well-developed.  HENT:     Head: Normocephalic and atraumatic.     Right Ear: Tympanic membrane normal.     Left Ear: Tympanic membrane normal.  Eyes:     Pupils: Pupils are equal, round, and reactive to light.  Neck:  Musculoskeletal: Normal range of motion and neck supple.     Thyroid: No thyromegaly.  Cardiovascular:     Rate and Rhythm: Normal rate and regular rhythm.     Heart sounds: Normal heart sounds. No murmur.  Pulmonary:     Effort: Pulmonary effort is normal. No respiratory distress.     Breath sounds: Normal breath sounds. No wheezing.  Abdominal:     General: Bowel sounds are normal. There is no distension.     Palpations: Abdomen is soft.     Tenderness: There is no abdominal tenderness.  Musculoskeletal: Normal range of motion.        General: No tenderness.  Skin:    General: Skin is warm and dry.  Neurological:     Mental Status: She is alert and oriented to person, place, and time.     Cranial Nerves:  No cranial nerve deficit.     Deep Tendon Reflexes: Reflexes are normal and symmetric.  Psychiatric:        Behavior: Behavior normal.        Thought Content: Thought content normal.        Judgment: Judgment normal.    No flowsheet data found.      BP (!) 151/81   Pulse (!) 54   Temp 97.8 F (36.6 C) (Temporal)   Ht '5\' 8"'  (1.727 m)   Wt 134 lb 6.4 oz (61 kg)   SpO2 99%   BMI 20.44 kg/m      Assessment & Plan:  [name] comes in today with chief complaint of Annual Exam   Diagnosis and orders addressed:  1. Annual physical exam - CMP14+EGFR - Hepatitis C antibody - TSH - Lipid panel - CBC with Differential/Platelet  2. Elevated blood pressure reading Pt states her BP has never been elevated. She does report being stressed today. Will hold off on medications. She will start monitoring at home and call office if constantly greater than >140/90 - CMP14+EGFR - CBC with Differential/Platelet  3. Decreased hearing of both ears - CMP14+EGFR - CBC with Differential/Platelet - Ambulatory referral to Audiology  4. Dizziness - CMP14+EGFR - TSH - CBC with Differential/Platelet - Ambulatory referral to Neurology  5. Nonintractable headache, unspecified chronicity pattern, unspecified headache type - CMP14+EGFR - TSH - CBC with Differential/Platelet - Ambulatory referral to Neurology  6. Need for hepatitis C screening test - CMP14+EGFR - Hepatitis C antibody - CBC with Differential/Platelet  7. History of hip replacement, unspecified laterality - Heavy Metals Profile II, Blood  8. Need for immunization against influenza - Flu Vaccine QUAD High Dose(Fluad)   Labs pending Health Maintenance reviewed Diet and exercise encouraged  Follow up plan: 6 months    [name], FNP

## 2019-03-07 ENCOUNTER — Other Ambulatory Visit: Payer: Self-pay | Admitting: Family

## 2019-03-07 LAB — LIPID PANEL
Chol/HDL Ratio: 2.6 ratio (ref 0.0–4.4)
Cholesterol, Total: 207 mg/dL — ABNORMAL HIGH (ref 100–199)
HDL: 79 mg/dL (ref >39)
LDL Chol Calc (NIH): 111 mg/dL — ABNORMAL HIGH (ref 0–99)
Triglycerides: 100 mg/dL (ref 0–149)
VLDL Cholesterol Cal: 17 mg/dL (ref 5–40)

## 2019-03-07 LAB — CMP14+EGFR
ALT: 12 IU/L (ref 0–32)
AST: 21 IU/L (ref 0–40)
Albumin/Globulin Ratio: 2 (ref 1.2–2.2)
Albumin: 4.3 g/dL (ref 3.8–4.8)
Alkaline Phosphatase: 92 IU/L (ref 39–117)
BUN/Creatinine Ratio: 20 (ref 12–28)
BUN: 16 mg/dL (ref 8–27)
Bilirubin Total: <0.2 mg/dL (ref 0.0–1.2)
CO2: 25 mmol/L (ref 20–29)
Calcium: 10.4 mg/dL — ABNORMAL HIGH (ref 8.7–10.3)
Chloride: 102 mmol/L (ref 96–106)
Creatinine, Ser: 0.79 mg/dL (ref 0.57–1.00)
GFR calc Af Amer: 88 mL/min/{1.73_m2} (ref >59)
GFR calc non Af Amer: 77 mL/min/{1.73_m2} (ref >59)
Globulin, Total: 2.2 g/dL (ref 1.5–4.5)
Glucose: 98 mg/dL (ref 65–99)
Potassium: 4.3 mmol/L (ref 3.5–5.2)
Sodium: 140 mmol/L (ref 134–144)
Total Protein: 6.5 g/dL (ref 6.0–8.5)

## 2019-03-07 LAB — HEAVY METALS PROFILE II, BLOOD
Arsenic: 6 ug/L (ref 2–23)
Cadmium: 0.7 ug/L (ref 0.0–1.2)
Lead, Blood: 1 ug/dL (ref 0–4)
Mercury: 1.2 ug/L (ref 0.0–14.9)

## 2019-03-07 LAB — CBC WITH DIFFERENTIAL/PLATELET
Basophils Absolute: 0 10*3/uL (ref 0.0–0.2)
Basos: 1 %
EOS (ABSOLUTE): 0.1 10*3/uL (ref 0.0–0.4)
Eos: 2 %
Hematocrit: 39.8 % (ref 34.0–46.6)
Hemoglobin: 13.3 g/dL (ref 11.1–15.9)
Immature Grans (Abs): 0 10*3/uL (ref 0.0–0.1)
Immature Granulocytes: 0 %
Lymphocytes Absolute: 1.6 10*3/uL (ref 0.7–3.1)
Lymphs: 24 %
MCH: 30.6 pg (ref 26.6–33.0)
MCHC: 33.4 g/dL (ref 31.5–35.7)
MCV: 92 fL (ref 79–97)
Monocytes Absolute: 0.5 10*3/uL (ref 0.1–0.9)
Monocytes: 7 %
Neutrophils Absolute: 4.4 10*3/uL (ref 1.4–7.0)
Neutrophils: 66 %
Platelets: 237 10*3/uL (ref 150–450)
RBC: 4.35 x10E6/uL (ref 3.77–5.28)
RDW: 13.1 % (ref 11.7–15.4)
WBC: 6.6 10*3/uL (ref 3.4–10.8)

## 2019-03-07 LAB — HEPATITIS C ANTIBODY: Hep C Virus Ab: <0.1 s/co ratio (ref 0.0–0.9)

## 2019-03-07 LAB — TSH: TSH: 3.04 u[IU]/mL (ref 0.450–4.500)

## 2019-03-07 MED ORDER — ATORVASTATIN CALCIUM 20 MG PO TABS: 20.0000 mg | ORAL_TABLET | Freq: Every day | ORAL | 3 refills | Status: DC

## 2019-04-03 ENCOUNTER — Telehealth: Payer: Self-pay | Admitting: Family

## 2019-04-03 NOTE — Telephone Encounter (Signed)
lmtcb

## 2019-04-14 NOTE — Telephone Encounter (Signed)
Patient never returned call, encounter being closed.

## 2019-04-22 ENCOUNTER — Encounter: Payer: Self-pay | Admitting: Diagnostic Neuroimaging

## 2019-04-22 ENCOUNTER — Ambulatory Visit (INDEPENDENT_AMBULATORY_CARE_PROVIDER_SITE_OTHER): Payer: Medicare HMO | Admitting: Diagnostic Neuroimaging

## 2019-04-22 ENCOUNTER — Other Ambulatory Visit: Payer: Self-pay

## 2019-04-22 VITALS — BP 116/72 | HR 57 | Temp 97.8°F | Ht 68.0 in | Wt 134.0 lb

## 2019-04-22 DIAGNOSIS — R42 Dizziness and giddiness: Secondary | ICD-10-CM | POA: Diagnosis not present

## 2019-04-22 DIAGNOSIS — R413 Other amnesia: Secondary | ICD-10-CM | POA: Diagnosis not present

## 2019-04-22 NOTE — Progress Notes (Signed)
[facility]  PATIENT: Adrienne Jacobs DOB: 05-Nov-1950  REFERRING CLINICIAN: C hawks HISTORY FROM: patient  REASON FOR VISIT: new consult    HISTORICAL  CHIEF COMPLAINT:  Chief Complaint  Patient presents with  . Dizziness    rm 7, New Pt, "dizziness when I eat sugar or carbs, short term memory loss MMSE 24"  . Headache    HISTORY OF PRESENT ILLNESS:   67 year old female here for evaluation of cognitive problems, headaches, dizziness.  For past 6 to 12 months patient having more problems with comprehension, cognitive difficulties, driving directions, hearing and understanding.  She is not sure if she is having hearing loss problems or a cognitive problem.  She is able to do all of her activities of daily living.  She lives with her ex-husband and they are in a friendly basis.  Problems been noted by her friends and family.  Patient also has history of "ocular migraines" where she sees visual phenomenon transiently without headaches.  Patient also having episodes of lightheadedness and dizziness.  These seem to be triggered when she eats high carbohydrate meals.    REVIEW OF SYSTEMS: Full 14 system review of systems performed and negative with exception of: As per HPI.  Insomnia anxiety pain constipation spinning sensation increased thirst confusion.  ALLERGIES: No Known Allergies  HOME MEDICATIONS: Outpatient Medications Prior to Visit  Medication Sig Dispense Refill  . B Complex Vitamins (VITAMIN B-COMPLEX) TABS Take by mouth.    . diphenhydrAMINE HCl (BENADRYL PO) Take by mouth.    . Melatonin 5 MG CAPS Take 5 mg by mouth at bedtime as needed.    Marland Kitchen atorvastatin (LIPITOR) 20 MG tablet Take 1 tablet (20 mg total) by mouth daily. (Patient not taking: Reported on 04/22/2019) 90 tablet 3   No facility-administered medications prior to visit.     PAST MEDICAL HISTORY: Past Medical History:  Diagnosis Date  . Anxiety   . Arthritis   . Dizziness    "after eating carbs or sugar"  . Headache   . High cholesterol   . Hypertension   . Low back pain   . Ocular migraine   . Osteopenia   . Short-term memory loss     PAST SURGICAL HISTORY: Past Surgical History:  Procedure Laterality Date  . TOTAL HIP ARTHROPLASTY Left 2006    FAMILY HISTORY: Family History  Problem Relation Age of Onset  . Other Mother        liver failure  . Stroke Father   . Hypertension Father   . Hypertension Sister   . Diabetes Maternal Uncle     SOCIAL HISTORY: Social History   Socioeconomic History  . Marital status: Divorced    Spouse name: Not on file  . Number of children: 0  . Years of education: Not on file  . Highest education level: High school graduate  Occupational History  . Not on file  Social Needs  . Financial resource strain: Not on file  . Food insecurity    Worry: Not on file    Inability: Not on file  . Transportation needs    Medical: Not on file    Non-medical: Not on file  Tobacco Use  . Smoking status: Never Smoker  . Smokeless tobacco: Never Used  Substance and Sexual Activity  . Alcohol use: No  . Drug use: No  . Sexual activity: Not on file  Lifestyle  . Physical activity    Days per week: Not on file  Minutes per session: Not on file  . Stress: Not on file  Relationships  . Social Herbalist on phone: Not on file    Gets together: Not on file    Attends religious service: Not on file    Active member of club or organization: Not on file    Attends meetings of clubs or organizations: Not on file    Relationship status: Not on file  . Intimate partner violence    Fear of current or ex partner: Not on file    Emotionally abused: Not on file    Physically abused: Not on file    Forced sexual activity: Not on file  Other Topics Concern  . Not on file  Social History Narrative   Lives with ex husband   Caffeine- coffee 2 c     PHYSICAL EXAM  GENERAL EXAM/CONSTITUTIONAL: Vitals:   Vitals:   04/22/19 1242  BP: 116/72  Pulse: (!) 57  Temp: 97.8 F (36.6 C)  Weight: 134 lb (60.8 kg)  Height: 5\' 8"  (1.727 m)     Body mass index is 20.37 kg/m. Wt Readings from Last 3 Encounters:  04/22/19 134 lb (60.8 kg)  03/06/19 134 lb 6.4 oz (61 kg)  02/06/19 131 lb 3.2 oz (59.5 kg)     Patient is in no distress; well developed, nourished and groomed; neck is supple  CARDIOVASCULAR:  Examination of carotid arteries is normal; no carotid bruits  Regular rate and rhythm, no murmurs  Examination of peripheral vascular system by observation and palpation is normal  EYES:  Ophthalmoscopic exam of optic discs and posterior segments is normal; no papilledema or hemorrhages  No exam data present  MUSCULOSKELETAL:  Gait, strength, tone, movements noted in Neurologic exam below  NEUROLOGIC: MENTAL STATUS:  MMSE - Hayes Exam 04/22/2019 03/06/2019  Orientation to time 4 5  Orientation to Place 5 5  Registration 2 3  Attention/ Calculation 1 5  Recall 3 3  Language- name 2 objects 2 2  Language- repeat 1 1  Language- follow 3 step command 3 3  Language- read & follow direction 1 1  Write a sentence 1 1  Copy design 1 1  Total score 24 30    awake, alert, oriented to person, place and time  recent and remote memory intact  normal attention and concentration  language fluent, comprehension intact, naming intact  fund of knowledge appropriate  CRANIAL NERVE:   2nd - no papilledema on fundoscopic exam  2nd, 3rd, 4th, 6th - pupils equal and reactive to light, visual fields full to confrontation, extraocular muscles intact, no nystagmus  5th - facial sensation symmetric  7th - facial strength symmetric  8th - hearing intact  9th - palate elevates symmetrically, uvula midline  11th - shoulder shrug symmetric  12th - tongue protrusion midline  MOTOR:   normal bulk and tone, full strength in the BUE, BLE  SENSORY:   normal and  symmetric to light touch, temperature, vibration  COORDINATION:   finger-nose-finger, fine finger movements normal  REFLEXES:   deep tendon reflexes present and symmetric  GAIT/STATION:   narrow based gait     DIAGNOSTIC DATA (LABS, IMAGING, TESTING) - I reviewed patient records, labs, notes, testing and imaging myself where available.  Lab Results  Component Value Date   WBC 6.6 03/06/2019   HGB 13.3 03/06/2019   HCT 39.8 03/06/2019   MCV 92 03/06/2019   PLT 237 03/06/2019  Component Value Date/Time   NA 140 03/06/2019 1646   K 4.3 03/06/2019 1646   CL 102 03/06/2019 1646   CO2 25 03/06/2019 1646   GLUCOSE 98 03/06/2019 1646   BUN 16 03/06/2019 1646   CREATININE 0.79 03/06/2019 1646   CALCIUM 10.4 (H) 03/06/2019 1646   PROT 6.5 03/06/2019 1646   ALBUMIN 4.3 03/06/2019 1646   AST 21 03/06/2019 1646   ALT 12 03/06/2019 1646   ALKPHOS 92 03/06/2019 1646   BILITOT <0.2 03/06/2019 1646   GFRNONAA 77 03/06/2019 1646   GFRAA 88 03/06/2019 1646   Lab Results  Component Value Date   CHOL 207 (H) 03/06/2019   HDL 79 03/06/2019   LDLCALC 111 (H) 03/06/2019   TRIG 100 03/06/2019   CHOLHDL 2.6 03/06/2019   Lab Results  Component Value Date   HGBA1C 5.4 07/01/2015   No results found for: [id number] Lab Results  Component Value Date   TSH 3.040 03/06/2019       ASSESSMENT AND PLAN  68 y.o. year old female here with new onset of mild memory problems, cognitive difficulty, navigation problems, comprehension difficulties, hearing loss.  Dx:  1. Memory loss   2. Dizziness       PLAN:  MEMORY LOSS (? MCI vs prodromal neurodegenerative) - check MRI brain  - follow up audiology testing - optimize nutrition, exercise, sleep, stress mgmt - consider psychology evaluation for anxiety / insomnia - may consider neuropsychology testing in future - safety / supervision issues reviewed - caution with driving and finances  DIZZINESS  - check MRI brain   MIGRAINE VISUAL AURA (without headaches) - monitor  Orders Placed This Encounter  Procedures  . MR BRAIN W WO CONTRAST   Return pending test results, for pending if symptoms worsen or fail to improve.    Penni Bombard, MD [id number], 0000000 PM Certified in Neurology, Neurophysiology and Neuroimaging  [facility] [address], [place], [place] [address] [phone]

## 2019-04-22 NOTE — Patient Instructions (Addendum)
MEMORY LOSS / COGNITIVE ISSUES - check MRI brain  - optimize nutrition, exercise, sleep, stress mgmt - consider psychology evaluation for anxiety / insomnia - caution with driving and finances  DIZZINESS  - check MRI brain  MIGRAINE VISUAL AURA (without headaches) - monitor

## 2019-05-13 ENCOUNTER — Telehealth: Payer: Self-pay | Admitting: Family

## 2019-05-13 NOTE — Telephone Encounter (Signed)
Reviewed labs from 03/2019 with pt and then pt wanted to schedule appt with Christy to discuss dizziness which she thinks might be coming from "fluid in my ears". Appt scheduled with Christy 12/10 at 10:40.

## 2019-05-15 ENCOUNTER — Ambulatory Visit: Payer: Medicare HMO | Admitting: Family

## 2019-05-20 ENCOUNTER — Other Ambulatory Visit: Payer: Commercial Managed Care - HMO

## 2019-06-17 ENCOUNTER — Other Ambulatory Visit: Payer: Self-pay

## 2019-06-17 ENCOUNTER — Ambulatory Visit: Payer: Medicare HMO | Attending: Family | Admitting: Audiology

## 2019-06-17 DIAGNOSIS — H9193 Unspecified hearing loss, bilateral: Secondary | ICD-10-CM

## 2019-06-17 DIAGNOSIS — H93299 Other abnormal auditory perceptions, unspecified ear: Secondary | ICD-10-CM

## 2019-06-17 DIAGNOSIS — H903 Sensorineural hearing loss, bilateral: Secondary | ICD-10-CM

## 2019-06-17 NOTE — Procedures (Signed)
[facility]  [place], [place] [address]  [phone]   Audiological Evaluation  Patient Name: Adrienne Jacobs   Status: Outpatient   DOB: [birth year]    Diagnosis: Hearing Loss MRN: [id number] Date:  06/17/2019     Referent: [name], FNP  History: Dezirae Dentler was seen for an audiological evaluation. Primary Concern: Hearing loss with comprehension concerns - trying to determine what is a cognitive vs a hearing issue.  Amaira Bradney states that she often has a delay, but they figures out what is said." "My husband tells me that I can't hear".  Pain: None History of hearing problems: Y - "a gradual hearing loss over the past several years".  History of dizziness/balance issues:   Y - "over the past few months, this occurs when eating sugar. For example, I can eat 2 figs without out difficulty, but if I eat a third fig, "something happens to my brain and I feel unsteady". History of occupational noise exposure: N History of diabetes:  N   Evaluation: Conventional pure tone audiometry from 250Hz  - 8000Hz  with using insert earphones.  Hearing Thresholds are fairly symmetrical ranging from 25-30 dBHL from 250Hz  - 1000Hz ; 30-35 dBHL at 2000Hz ; 60-70 dBHL from 3000Hz  - 4000Hz ; and 75 dBHL at 8000Hz . The hearing loss appears sensorineural bilaterally. Reliability is good Speech reception levels (repeating words near threshold) using recorded spondee word lists:  Right ear: 25 dBHL.  Left ear:  35 dBHL Word recognition (at comfortably loud volumes) using recorded NU-6 word lists, in quiet.  Right ear: 96% at 65 dBHL.  Left ear:   84% at 75 dBHL. Word recognition in minimal background noise:  +5 dBHL  Right ear: 30%                              Left ear:  68%  Tympanometry (middle ear function) with ipsilateral acoustic reflexes. Acoustic reflex decay is negative bilaterally at 1000Hz . Right ear: Normal (Type A) with ipsilateral  acoustic reflexes that range from 90dB at 500Hz  and 1000Hz ; 105 dB at 2000Hz  and no response at 4000Hz .  Left ear: Normal (Type A) with ipsilateral acoustic reflexes that range from 85 dB at 500Hz  and 1000Hz ; 105 dB at 2000Hz  and 4000Hz .  Distortion Product Otoacoustic Emissions (DPOAE's) was not completed because of the degree of hearing loss present.   Competing Sentences (CS) involved a different sentences being presented to each ear at different volumes. The instructions are to repeat the softer volume sentences. Posterior temporal issues will show poorer performance in the ear contralateral to the lobe involved.  Neko scored 50% in the right ear and 100% in the left ear.  The test results are abnormal on the right side with normal results on the left side. These results are consistent with Central Auditory Processing Disorder (CAPD) with poor binaural integration.  Dichotic Digits (DD) presents different two digits to each ear. All four digits are to be repeated.  Alonah scored 85% in the right ear which is considered borderline normal because two of the abnormal results were mistaking five for nine, which may be related to her degree of hearing loss. Shalie scored 95% in the left ear which is within normal limits.   CONCLUSION:      Lavon has a mild low to mid range hearing loss sharply sloping to a moderately severe to a severe high frequency hearing loss bilaterally.  The hearing loss appears sensorineural. This amount of hearing loss would adversely affect speech communication at even loud conversational speech levels. Middle ear volume and pressure are within normal limits with symmetrical acoustic reflexes that are consistent with the degree of hearing loss bilaterally (the left tympanic membrane is hypercompliant (possibly associated with allergies) which may or may not be significant.   In quiet, word recognition is excellent in the right ear at louder than normal speech levels with good  word recognition in the left ear in at normally loud conversational speech levels. In minimal background noise, word recognition drops to very poor in the right ear while dropping to fair in the left ear.      Since Apiphany Trochez had concerns about whether communication/understanding difficulty was hearing loss or cognitively related a few auditory processing tests were administered. The dichotic digits was within normal limits on the left side with the right side slightly abnormal with the types of errors attributed to the degree of hearing loss. However, the competing sentence test clearly shows the right ear abnormal with left ear within normal limits which is consistent with auditory processing issues. As discussed, Raeden Battie poor hearing must be improved to help determine whether it (or auditory fatigue) is adversely affecting her "cognition".   As discussed with Candis Musa, a hearing aid evaluation is strongly recommended. For most people, hearing aids in both ears are of the most benefit; however, Brina has significantly poorer word recognition when a competing message is present and much poorer processing on the right side compared to the left side.  As discussed with Candis Musa - a left ear hearing aid is definitely recommended, but whether a hearing aid in the right ear will be of benefit (and not reduce the clarity of her overall hearing) may be determined by trial with the dispensing audiologist. Amplification helps make the signal louder and therefore often improves hearing and word recognition.  Amplification has many forms including hearing aids in one or both ears, an assistive listening device which have a microphone and speaker such as a small handheld device and/or even a surround sound system of speakers.  Amplification may be covered by some insurances, but not all.  It is important to note that hearing aids must be individually fit according to the hearing test results and  the ear shape.  Audiologists and hearing aid dealers in [place] must be licensed in order to dispense hearing aids.  In addition, a trial period is mandated by law in our state because often amplification must be tried and then evaluated in order to determine benefit.    After reviewing the test results, because of the asymmetrical test results with the right ear performing much poorer when a competing message is present, further evaluation by an ENT is recommended. With medical clearance, a hearing aid evaluation would is recommended.     RECOMMENDATIONS: 1.   Refer to ENT because of asymmetrical word recognition in quiet and in the opposite ear in minimal background noise.    2.   After ENT clearance, a hearing aid evaluation - in the left ear and possibly also in the right ear if the hearing aid is of benefit considering right ear poor word recognition when a competing message is present.      3.   Monitor hearing closely with a repeat audiological evaluation in 6-12 months (earlier if there is any change in hearing or ear pressure).    4.  Strategies  that help improve hearing include: A) Face the speaker directly. Optimal is having the speakers face well - lit.  Unless amplified, being within 3-6 feet of the speaker will enhance word recognition. B) Avoid having the speaker back-lit as this will minimize the ability to use cues from lip-reading, facial expression and gestures. C)  Word recognition is poorer in background noise. For optimal word recognition, turn off the TV, radio or noisy fan when engaging in conversation. In a restaurant, try to sit away from noise sources and close to the primary speaker.  D)  Ask for topic clarification from time to time in order to remain in the conversation.  Most people don't mind repeating or clarifying a point when asked.  If needed, explain the difficulty hearing in background noise or hearing loss.  5.   For low cost options to help hearing  consider a personal amplification system such as:  A) Bose hear phones  B) https://www.soundworldsolutions.com/  6.   Use hearing protection during noisy activities such as using a weed eater, moving the lawn, shooting, etc.    Musician's plugs, are available from [employer].com for music related hearing protection because there is no distortion.  Other hearing protection, such as sponge plugs (available at pharmacies) or earmuffs (available at sporting goods stores or department stores such as Paediatric nurse) are useful for noisy activities and venues. Maudene L. Heide Spark Au.D., CCC-A Doctor of Audiology 06/17/2019   cc: [name], FNP

## 2020-01-14 ENCOUNTER — Other Ambulatory Visit: Payer: Self-pay

## 2020-01-14 DIAGNOSIS — Z780 Asymptomatic menopausal state: Secondary | ICD-10-CM

## 2020-04-20 ENCOUNTER — Other Ambulatory Visit: Payer: Self-pay

## 2020-04-20 ENCOUNTER — Other Ambulatory Visit (INDEPENDENT_AMBULATORY_CARE_PROVIDER_SITE_OTHER): Payer: Medicare HMO

## 2020-04-20 DIAGNOSIS — Z780 Asymptomatic menopausal state: Secondary | ICD-10-CM

## 2020-04-20 DIAGNOSIS — M810 Age-related osteoporosis without current pathological fracture: Secondary | ICD-10-CM | POA: Diagnosis not present

## 2020-04-21 ENCOUNTER — Other Ambulatory Visit: Payer: Self-pay | Admitting: Family

## 2020-04-21 MED ORDER — ALENDRONATE SODIUM 70 MG PO TABS: 70.0000 mg | ORAL_TABLET | ORAL | 11 refills | Status: DC

## 2020-05-04 ENCOUNTER — Telehealth: Payer: Self-pay | Admitting: Pharmacist

## 2020-05-04 ENCOUNTER — Ambulatory Visit (INDEPENDENT_AMBULATORY_CARE_PROVIDER_SITE_OTHER): Payer: Medicare HMO | Admitting: Pharmacist

## 2020-05-04 DIAGNOSIS — M810 Age-related osteoporosis without current pathological fracture: Secondary | ICD-10-CM | POA: Diagnosis not present

## 2020-05-04 MED ORDER — DENOSUMAB 60 MG/ML ~~LOC~~ SOSY: 60.0000 mg | PREFILLED_SYRINGE | Freq: Once | SUBCUTANEOUS | 3 refills | Status: AC

## 2020-05-04 NOTE — Progress Notes (Signed)
    05/04/2020 Name: Adrienne Jacobs MRN: [id number] DOB: [birth year]   S:  66 yoF Presents for osteoporosis education, and management  Current Height:  5'8"      Max Lifetime Height:  5'8" Current Weight:   134 lbs      Ethnicity:Caucasian  BP:   116/72       HPI: Does pt already have a diagnosis of:  Osteopenia?  Yes Osteoporosis?  Yes  Back Pain?  Yes       Kyphosis?  No Prior fracture?  No Med(s) for Osteoporosis/Osteopenia:  fosamax Med(s) previously tried for Osteoporosis/Osteopenia:  n/a                                                             PMH: Age at menopause:  12s Hysterectomy?  No Oophorectomy?  No HRT? No Steroid Use?  No Thyroid med?  No History of cancer?  No History of digestive disorders (ie Crohn's)?  No Current or previous eating disorders?  No Last Vitamin D Result: 44.8 Last GFR Result:  77 (2020)   FH/SH: Family history of osteoporosis?  No Parent with history of hip fracture?  No Family history of breast cancer?  No Exercise?  No Smoking?  No Alcohol?  No    Calcium Assessment Calcium Intake  # of servings/day  Calcium mg  Milk (8 oz) 0  x  300  = 0  Yogurt (4 oz) 0 x  200 = 0  Cheese (1 oz) 0 x  200 = 0  Other Calcium sources   250mg   Ca supplement 0 = 0   Estimated calcium intake per day 250    DEXA Results 04/20/2020 FINDINGS: AP LUMBAR SPINE L1 through L4 Bone Mineral Density (BMD):  0.708 g/cm2 Young Adult T-Score:  -3.9 Z-Score:  -2.1  RIGHT FEMUR NECK Bone Mineral Density (BMD):  0.668 g/cm2 Young Adult T-Score: -2.7 Z-Score:  -0.9  RIGHT FOREARM (1/3 RADIUS) Bone Mineral Density (BMD):  0.660 Young Adult T Score:  -2.6 Z Score:  -0.7  Lifetime vegetarian  Assessment: Patient's diagnostic category is OSTEOPOROSIS by WHO Criteria.  FRACTURE RISK: INCREASED  FRAX: World Health Organization FRAX assessment of absolute fracture risk is not calculated for this patient because the patient  has osteoporosis.  Recommendations: 1.  Recommend Prolia (has tried Fosamax and does not wish to take)--we will explore insurance benefits which will take some time 2.  recommend calcium 1200mg  daily through supplementation or diet.  3.  recommend weight bearing exercise - 30 minutes at least 4 days per week.   4.  Counseled and educated about fall risk and prevention.  Recheck DEXA:  2 years  Time spent counseling patient:  20 minutes    Regina Eck, PharmD, BCPS Clinical Pharmacist, [place]  II Phone [phone]

## 2020-05-04 NOTE — Telephone Encounter (Signed)
Please see new start prolia  Patient to consider pending cost Will likely need PA, etc

## 2020-05-04 NOTE — Telephone Encounter (Signed)
Adrienne Jacobs, is there something I need to do with this?

## 2020-05-04 NOTE — Telephone Encounter (Signed)
Please see new start prolia  Patient to consider pending cost

## 2020-05-11 DIAGNOSIS — M810 Age-related osteoporosis without current pathological fracture: Secondary | ICD-10-CM | POA: Insufficient documentation

## 2020-05-24 ENCOUNTER — Ambulatory Visit (INDEPENDENT_AMBULATORY_CARE_PROVIDER_SITE_OTHER): Payer: Medicare HMO | Admitting: Family

## 2020-05-24 ENCOUNTER — Encounter: Payer: Self-pay | Admitting: Family

## 2020-05-24 ENCOUNTER — Other Ambulatory Visit: Payer: Self-pay

## 2020-05-24 VITALS — BP 125/72 | HR 54 | Temp 97.5°F | Ht 68.0 in | Wt 131.0 lb

## 2020-05-24 DIAGNOSIS — B351 Tinea unguium: Secondary | ICD-10-CM | POA: Diagnosis not present

## 2020-05-24 DIAGNOSIS — N952 Postmenopausal atrophic vaginitis: Secondary | ICD-10-CM | POA: Diagnosis not present

## 2020-05-24 DIAGNOSIS — L989 Disorder of the skin and subcutaneous tissue, unspecified: Secondary | ICD-10-CM

## 2020-05-24 DIAGNOSIS — Z0001 Encounter for general adult medical examination with abnormal findings: Secondary | ICD-10-CM | POA: Diagnosis not present

## 2020-05-24 DIAGNOSIS — M810 Age-related osteoporosis without current pathological fracture: Secondary | ICD-10-CM

## 2020-05-24 DIAGNOSIS — Z0000 Encounter for general adult medical examination without abnormal findings: Secondary | ICD-10-CM

## 2020-05-24 DIAGNOSIS — L821 Other seborrheic keratosis: Secondary | ICD-10-CM

## 2020-05-24 DIAGNOSIS — Z1211 Encounter for screening for malignant neoplasm of colon: Secondary | ICD-10-CM

## 2020-05-24 DIAGNOSIS — Z136 Encounter for screening for cardiovascular disorders: Secondary | ICD-10-CM | POA: Diagnosis not present

## 2020-05-24 MED ORDER — ESTRADIOL 0.1 MG/GM VA CREA: 1.0000 | TOPICAL_CREAM | Freq: Every day | VAGINAL | 12 refills | Status: DC

## 2020-05-24 NOTE — Progress Notes (Signed)
° °  Subjective:    Patient ID: Adrienne Jacobs, female    DOB: 02/07/1951, 69 y.o.   MRN: [id number]  Chief Complaint  Patient presents with   Annual Exam    HPI Pt presents to the office today for CPE. Pt denies any headache, palpitations, SOB, or edema at this time.   She reports right second toe thickness. She noticed this several months ago. Denies any pain or injury.   She is also complaining of several skin lesions on her face that is unchanged. Denies any pain.   She is also complaining of vaginal erythemas and pain at times. Denies any discharge or itching. She is not currently sexually active.     Review of Systems  All other systems reviewed and are negative.      Objective:   Physical Exam Vitals reviewed.  Constitutional:      General: She is not in acute distress.    Appearance: She is well-developed and well-nourished.  HENT:     Head: Normocephalic and atraumatic.     Right Ear: Tympanic membrane normal.     Left Ear: Tympanic membrane normal.     Mouth/Throat:     Mouth: Oropharynx is clear and moist.  Eyes:     Pupils: Pupils are equal, round, and reactive to light.  Neck:     Thyroid: No thyromegaly.  Cardiovascular:     Rate and Rhythm: Normal rate and regular rhythm.     Pulses: Intact distal pulses.     Heart sounds: Normal heart sounds. No murmur heard.   Pulmonary:     Effort: Pulmonary effort is normal. No respiratory distress.     Breath sounds: Normal breath sounds. No wheezing.  Abdominal:     General: Bowel sounds are normal. There is no distension.     Palpations: Abdomen is soft.     Tenderness: There is no abdominal tenderness.  Genitourinary:    Labia:        Right: No rash or tenderness.        Left: No rash or tenderness.      Comments: Skin normal, slight erythemas and cracked, vaginal atrophy present Musculoskeletal:        General: No tenderness or edema. Normal range of motion.     Cervical back: Normal range of  motion and neck supple.  Skin:    General: Skin is warm and dry.     Comments:  Two toenail thick, fifth metatarsal toenail thick   Neurological:     Mental Status: She is alert and oriented to person, place, and time.     Cranial Nerves: No cranial nerve deficit.     Deep Tendon Reflexes: Reflexes are normal and symmetric.  Psychiatric:        Mood and Affect: Mood and affect normal.        Behavior: Behavior normal.        Thought Content: Thought content normal.        Judgment: Judgment normal.          BP 125/72    Pulse (!) 54    Temp (!) 97.5 F (36.4 C) (Temporal)    Ht 5\' 8"  (1.727 m)    Wt 131 lb (59.4 kg)    BMI 19.92 kg/m   Assessment & Plan:

## 2020-05-24 NOTE — Progress Notes (Signed)
Subjective:    Patient ID: Adrienne Jacobs, female    DOB: 1951/03/10, 69 y.o.   MRN: [id number]  Chief Complaint  Patient presents with  . Annual Exam    HPI Pt presents to the office today for CPE. Pt denies any headache, palpitations, SOB, or edema at this time.   She reports right second toe thickness. She noticed this several months ago. Denies any pain or injury.   She is also complaining of several skin lesions on her face that is unchanged. Denies any pain.   She is also complaining of vaginal erythemas and pain at times. Denies any discharge or itching. She is not currently sexually active.   Review of Systems  All other systems reviewed and are negative.  Social History   Socioeconomic History  . Marital status: Divorced    Spouse name: Not on file  . Number of children: 0  . Years of education: Not on file  . Highest education level: High school graduate  Occupational History  . Not on file  Tobacco Use  . Smoking status: Never Smoker  . Smokeless tobacco: Never Used  Vaping Use  . Vaping Use: Never used  Substance and Sexual Activity  . Alcohol use: No  . Drug use: No  . Sexual activity: Not on file  Other Topics Concern  . Not on file  Social History Narrative   Lives with ex husband   Caffeine- coffee 2 c   Social Determinants of Health   Financial Resource Strain: Not on file  Food Insecurity: Not on file  Transportation Needs: Not on file  Physical Activity: Not on file  Stress: Not on file  Social Connections: Not on file   Family History  Problem Relation Age of Onset  . Other Mother        liver failure  . Stroke Father   . Hypertension Father   . Hypertension Sister   . Diabetes Maternal Uncle        Objective:   Physical Exam Vitals reviewed.  Constitutional:      General: She is not in acute distress.    Appearance: She is well-developed and well-nourished.  HENT:     Head: Normocephalic and atraumatic.     Right Ear:  Tympanic membrane normal.     Left Ear: Tympanic membrane normal.     Mouth/Throat:     Mouth: Oropharynx is clear and moist.  Eyes:     Pupils: Pupils are equal, round, and reactive to light.  Neck:     Thyroid: No thyromegaly.  Cardiovascular:     Rate and Rhythm: Normal rate and regular rhythm.     Pulses: Intact distal pulses.     Heart sounds: Normal heart sounds. No murmur heard.   Pulmonary:     Effort: Pulmonary effort is normal. No respiratory distress.     Breath sounds: Normal breath sounds. No wheezing.  Abdominal:     General: Bowel sounds are normal. There is no distension.     Palpations: Abdomen is soft.     Tenderness: There is no abdominal tenderness.  Musculoskeletal:        General: No tenderness or edema. Normal range of motion.     Cervical back: Normal range of motion and neck supple.  Skin:    General: Skin is warm and dry.     Comments: Two toenail thick, fifth metatarsal toenail thick  Neurological:     Mental Status: She is alert  and oriented to person, place, and time.     Cranial Nerves: No cranial nerve deficit.     Deep Tendon Reflexes: Reflexes are normal and symmetric.  Psychiatric:        Mood and Affect: Mood and affect normal.        Behavior: Behavior normal.        Thought Content: Thought content normal.        Judgment: Judgment normal.       BP 125/72   Pulse (!) 54   Temp (!) 97.5 F (36.4 C) (Temporal)   Ht _0  (1.727 m)   Wt 131 lb (59.4 kg)   BMI 19.92 kg/m      Assessment & Plan:  Adrienne Jacobs comes in today with chief complaint of Annual Exam   Diagnosis and orders addressed:  1. Annual physical exam - CMP14+EGFR - CBC with Differential/Platelet - Lipid panel - TSH  2. Onychomycosis - Will try OTC products, and think about orals - CMP14+EGFR - CBC with Differential/Platelet  3. Skin lesion of face Referral to derm pending - Ambulatory referral to Dermatology - CMP14+EGFR - CBC with  Differential/Platelet  4. Osteoporosis without pathological fracture Education provided  - CMP14+EGFR - CBC with Differential/Platelet  5. Seborrheic keratosis - CMP14+EGFR - CBC with Differential/Platelet  6. Colon cancer screening - Cologuard - CMP14+EGFR - CBC with Differential/Platelet  7. Vaginal atrophy Start estrace TID a week - estradiol (ESTRACE VAGINAL) 0.1 MG/GM vaginal cream; Place 1 Applicatorful vaginally at bedtime.  Dispense: 42.5 g; Refill: 12   Labs pending Health Maintenance reviewed Diet and exercise encouraged  Follow up plan: 6 months    [name], FNP

## 2020-05-24 NOTE — Patient Instructions (Addendum)
Onychomycosis/Fungal Toenails  WHAT IS IT? An infection that lies within the keratin of your nail plate that is caused by a fungus.  WHY ME? Fungal infections affect all ages, sexes, races, and creeds.  There may be many factors that predispose you to a fungal infection such as age, coexisting medical conditions such as diabetes, or an autoimmune disease; stress, medications, fatigue, genetics, etc.  Bottom line: fungus thrives in a warm, moist environment and your shoes offer such a location.  IS IT CONTAGIOUS? Theoretically, yes.  You do not want to share shoes, nail clippers or files with someone who has fungal toenails.  Walking around barefoot in the same room or sleeping in the same bed is unlikely to transfer the organism.  It is important to realize, however, that fungus can spread easily from one nail to the next on the same foot.  HOW DO WE TREAT THIS?  There are several ways to treat this condition.  Treatment may depend on many factors such as age, medications, pregnancy, liver and kidney conditions, etc.  It is best to ask your doctor which options are available to you.  1. No treatment.   Unlike many other medical concerns, you can live with this condition.  However for many people this can be a painful condition and may lead to ingrown toenails or a bacterial infection.  It is recommended that you keep the nails cut short to help reduce the amount of fungal nail. 2. Topical treatment.  These range from herbal remedies to prescription strength nail lacquers.  About 40-50% effective, topicals require twice daily application for approximately 9 to 12 months or until an entirely new nail has grown out.  The most effective topicals are medical grade medications available through physicians offices. 3. Oral antifungal medications.  With an 80-90% cure rate, the most common oral medication requires 3 to 4 months of therapy and stays in your system for a year as the new nail grows out.  Oral  antifungal medications do require blood work to make sure it is a safe drug for you.  A liver function panel will be performed prior to starting the medication and after the first month of treatment.  It is important to have the blood work performed to avoid any harmful side effects.  In general, this medication safe but blood work is required. 4. Laser Therapy.  This treatment is performed by applying a specialized laser to the affected nail plate.  This therapy is noninvasive, fast, and non-painful.  It is not covered by insurance and is therefore, out of pocket.  The results have been very good with a 80-95% cure rate.  [facility] is the only practice in the area to offer this therapy. Permanent Nail Avulsion.  Removing the entire nail so that a new nail will not grow back.     Osteoporosis  Osteoporosis is thinning and loss of density in your bones. Osteoporosis makes bones more brittle and fragile and more likely to break (fracture). Over time, osteoporosis can cause your bones to become so weak that they fracture after a minor fall. Bones in the hip, wrist, and spine are most likely to fracture due to osteoporosis. What are the causes? The exact cause of this condition is not known. What increases the risk? You may be at greater risk for osteoporosis if you:  Have a family history of the condition.  Have poor nutrition.  Use steroid medicines, such as prednisone.  Are female.    Are age 50 or older.  Smoke or have a history of smoking.  Are not physically active (are sedentary).  Are white (Caucasian) or of Asian descent.  Have a small body frame.  Take certain medicines, such as antiseizure medicines. What are the signs or symptoms? A fracture might be the first sign of osteoporosis, especially if the fracture results from a fall or injury that usually would not cause a bone to break. Other signs and symptoms include:  Pain in the neck or low back.  Stooped  posture.  Loss of height. How is this diagnosed? This condition may be diagnosed based on:  Your medical history.  A physical exam.  A bone mineral density test, also called a DXA or DEXA test (dual-energy X-ray absorptiometry test). This test uses X-rays to measure the amount of minerals in your bones. How is this treated? The goal of treatment is to strengthen your bones and lower your risk for a fracture. Treatment may involve:  Making lifestyle changes, such as: ? Including foods with more calcium and vitamin D in your diet. ? Doing weight-bearing and muscle-strengthening exercises. ? Stopping tobacco use. ? Limiting alcohol intake.  Taking medicine to slow the process of bone loss or to increase bone density.  Taking daily supplements of calcium and vitamin D.  Taking hormone replacement medicines, such as estrogen for women and testosterone for men.  Monitoring your levels of calcium and vitamin D. Follow these instructions at home:  Activity  Exercise as told by your health care provider. Ask your health care provider what exercises and activities are safe for you. You should do: ? Exercises that make you work against gravity (weight-bearing exercises), such as tai chi, yoga, or walking. ? Exercises to strengthen muscles, such as lifting weights. Lifestyle  Limit alcohol intake to no more than 1 drink a day for nonpregnant women and 2 drinks a day for men. One drink equals 12 oz of beer, 5 oz of wine, or 1 oz of hard liquor.  Do not use any products that contain nicotine or tobacco, such as cigarettes and e-cigarettes. If you need help quitting, ask your health care provider. Preventing falls  Use devices to help you move around (mobility aids) as needed, such as canes, walkers, scooters, or crutches.  Keep rooms well-lit and clutter-free.  Remove tripping hazards from walkways, including cords and throw rugs.  Install grab bars in bathrooms and safety rails on  stairs.  Use rubber mats in the bathroom and other areas that are often wet or slippery.  Wear closed-toe shoes that fit well and support your feet. Wear shoes that have rubber soles or low heels.  Review your medicines with your health care provider. Some medicines can cause dizziness or changes in blood pressure, which can increase your risk of falling. General instructions  Include calcium and vitamin D in your diet. Calcium is important for bone health, and vitamin D helps your body to absorb calcium. Good sources of calcium and vitamin D include: ? Certain fatty fish, such as salmon and tuna. ? Products that have calcium and vitamin D added to them (fortified products), such as fortified cereals. ? Egg yolks. ? Cheese. ? Liver.  Take over-the-counter and prescription medicines only as told by your health care provider.  Keep all follow-up visits as told by your health care provider. This is important. Contact a health care provider if:  You have never been screened for osteoporosis and you are: ? A woman   who is age 60 or older. ? A man who is age 15 or older. Get help right away if:  You fall or injure yourself. Summary  Osteoporosis is thinning and loss of density in your bones. This makes bones more brittle and fragile and more likely to break (fracture),even with minor falls.  The goal of treatment is to strengthen your bones and reduce your risk for a fracture.  Include calcium and vitamin D in your diet. Calcium is important for bone health, and vitamin D helps your body to absorb calcium.  Talk with your health care provider about screening for osteoporosis if you are a woman who is age 66 or older, or a man who is age 79 or older. This information is not intended to replace advice given to you by your health care provider. Make sure you discuss any questions you have with your health care provider. Document Revised: 05/04/2017 Document Reviewed: 03/16/2017 Elsevier  Patient Education  2020 [place].    Seborrheic Dermatitis, Adult Seborrheic dermatitis is a skin disease that causes red, scaly patches. It usually occurs on the scalp, and it is often called dandruff. The patches may appear on other parts of the body. Skin patches tend to appear where there are many oil glands in the skin. Areas of the body that are commonly affected include:  Scalp.  Skin folds of the body.  Ears.  Eyebrows.  Neck.  Face.  Armpits.  The bearded area of men's faces. The condition may come and go for no known reason, and it is often long-lasting (chronic). What are the causes? The cause of this condition is not known. What increases the risk? This condition is more likely to develop in people who:  Have certain conditions, such as: ? HIV (human immunodeficiency virus). ? AIDS (acquired immunodeficiency syndrome). ? Parkinson disease. ? Mood disorders, such as depression.  Are 76-74 years old. What are the signs or symptoms? Symptoms of this condition include:  Thick scales on the scalp.  Redness on the face or in the armpits.  Skin that is flaky. The flakes may be white or yellow.  Skin that seems oily or dry but is not helped with moisturizers.  Itching or burning in the affected areas. How is this diagnosed? This condition is diagnosed with a medical history and physical exam. A sample of your skin may be tested (skin biopsy). You may need to see a skin specialist (dermatologist). How is this treated? There is no cure for this condition, but treatment can help to manage the symptoms. You may get treatment to remove scales, lower the risk of skin infection, and reduce swelling or itching. Treatment may include:  Creams that reduce swelling and irritation (steroids).  Creams that reduce skin yeast.  Medicated shampoo, soaps, moisturizing creams, or ointments.  Medicated moisturizing creams or ointments. Follow these instructions at  home:  Apply over-the-counter and prescription medicines only as told by your health care provider.  Use any medicated shampoo, soaps, skin creams, or ointments only as told by your health care provider.  Keep all follow-up visits as told by your health care provider. This is important. Contact a health care provider if:  Your symptoms do not improve with treatment.  Your symptoms get worse.  You have new symptoms. This information is not intended to replace advice given to you by your health care provider. Make sure you discuss any questions you have with your health care provider. Document Revised: 05/04/2017 Document Reviewed:  09/09/2015 Elsevier Patient Education  [place].

## 2020-05-25 LAB — CBC WITH DIFFERENTIAL/PLATELET
Basophils Absolute: 0 10*3/uL (ref 0.0–0.2)
Basos: 0 %
EOS (ABSOLUTE): 0.1 10*3/uL (ref 0.0–0.4)
Eos: 3 %
Hematocrit: 37.2 % (ref 34.0–46.6)
Hemoglobin: 12.7 g/dL (ref 11.1–15.9)
Immature Grans (Abs): 0 10*3/uL (ref 0.0–0.1)
Immature Granulocytes: 0 %
Lymphocytes Absolute: 1.6 10*3/uL (ref 0.7–3.1)
Lymphs: 30 %
MCH: 31.7 pg (ref 26.6–33.0)
MCHC: 34.1 g/dL (ref 31.5–35.7)
MCV: 93 fL (ref 79–97)
Monocytes Absolute: 0.4 10*3/uL (ref 0.1–0.9)
Monocytes: 8 %
Neutrophils Absolute: 3.1 10*3/uL (ref 1.4–7.0)
Neutrophils: 59 %
Platelets: 201 10*3/uL (ref 150–450)
RBC: 4.01 x10E6/uL (ref 3.77–5.28)
RDW: 12.9 % (ref 11.7–15.4)
WBC: 5.3 10*3/uL (ref 3.4–10.8)

## 2020-05-25 LAB — CMP14+EGFR
ALT: 15 IU/L (ref 0–32)
AST: 23 IU/L (ref 0–40)
Albumin/Globulin Ratio: 2 (ref 1.2–2.2)
Albumin: 4.3 g/dL (ref 3.8–4.8)
Alkaline Phosphatase: 76 IU/L (ref 44–121)
BUN/Creatinine Ratio: 19 (ref 12–28)
BUN: 14 mg/dL (ref 8–27)
Bilirubin Total: 0.2 mg/dL (ref 0.0–1.2)
CO2: 27 mmol/L (ref 20–29)
Calcium: 9.8 mg/dL (ref 8.7–10.3)
Chloride: 101 mmol/L (ref 96–106)
Creatinine, Ser: 0.74 mg/dL (ref 0.57–1.00)
GFR calc Af Amer: 95 mL/min/{1.73_m2} (ref >59)
GFR calc non Af Amer: 82 mL/min/{1.73_m2} (ref >59)
Globulin, Total: 2.2 g/dL (ref 1.5–4.5)
Glucose: 86 mg/dL (ref 65–99)
Potassium: 4.4 mmol/L (ref 3.5–5.2)
Sodium: 142 mmol/L (ref 134–144)
Total Protein: 6.5 g/dL (ref 6.0–8.5)

## 2020-05-25 LAB — LIPID PANEL
Chol/HDL Ratio: 2.7 ratio (ref 0.0–4.4)
Cholesterol, Total: 221 mg/dL — ABNORMAL HIGH (ref 100–199)
HDL: 81 mg/dL (ref >39)
LDL Chol Calc (NIH): 121 mg/dL — ABNORMAL HIGH (ref 0–99)
Triglycerides: 110 mg/dL (ref 0–149)
VLDL Cholesterol Cal: 19 mg/dL (ref 5–40)

## 2020-05-25 LAB — TSH: TSH: 2.93 u[IU]/mL (ref 0.450–4.500)

## 2020-07-07 DIAGNOSIS — D229 Melanocytic nevi, unspecified: Secondary | ICD-10-CM | POA: Diagnosis not present

## 2020-07-07 DIAGNOSIS — D485 Neoplasm of uncertain behavior of skin: Secondary | ICD-10-CM | POA: Diagnosis not present

## 2020-07-07 DIAGNOSIS — L814 Other melanin hyperpigmentation: Secondary | ICD-10-CM | POA: Diagnosis not present

## 2020-07-07 DIAGNOSIS — D1801 Hemangioma of skin and subcutaneous tissue: Secondary | ICD-10-CM | POA: Diagnosis not present

## 2020-07-07 DIAGNOSIS — L819 Disorder of pigmentation, unspecified: Secondary | ICD-10-CM | POA: Diagnosis not present

## 2020-07-07 DIAGNOSIS — L821 Other seborrheic keratosis: Secondary | ICD-10-CM | POA: Diagnosis not present

## 2020-07-07 DIAGNOSIS — L570 Actinic keratosis: Secondary | ICD-10-CM | POA: Diagnosis not present

## 2020-07-15 NOTE — Telephone Encounter (Signed)
Benefit Verification In Progress for prolia

## 2020-08-02 NOTE — Telephone Encounter (Signed)
PA in process  Key: [id number] - PA Case ID: [id number] Need help? Call us at [phone] Status Sent to [facility] 60MG /ML syringes

## 2020-08-04 NOTE — Telephone Encounter (Signed)
PA denied for prolia. The benefit provides coverage for the requested drug when medically necessary. However, the information submitted doesnt meet [facility] medical necessity guidelines for coverage. The member must meet the following criteria: has a history of osteoporotic fracture or has tried or cannot use zoledronic acid (step therapy requirement). This determination was based on the [place] (denosumab) Coverage Policy.

## 2020-08-05 MED ORDER — ALENDRONATE SODIUM 70 MG PO TABS: 70.0000 mg | ORAL_TABLET | ORAL | 11 refills | Status: DC

## 2020-08-05 NOTE — Telephone Encounter (Signed)
Fosamax Prescription sent to pharmacy.  

## 2020-08-05 NOTE — Telephone Encounter (Signed)
Left message to call back  

## 2020-12-10 ENCOUNTER — Ambulatory Visit (INDEPENDENT_AMBULATORY_CARE_PROVIDER_SITE_OTHER): Payer: Medicare HMO | Admitting: *Deleted

## 2020-12-10 ENCOUNTER — Encounter: Payer: Self-pay | Admitting: *Deleted

## 2020-12-10 DIAGNOSIS — Z0000 Encounter for general adult medical examination without abnormal findings: Secondary | ICD-10-CM

## 2020-12-10 DIAGNOSIS — Z9189 Other specified personal risk factors, not elsewhere classified: Secondary | ICD-10-CM | POA: Diagnosis not present
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
## 2020-12-13 ENCOUNTER — Telehealth: Payer: Self-pay

## 2020-12-13 NOTE — Chronic Care Management (AMB) (Signed)
  Chronic Care Management   Note  12/13/2020 Name: Aretha Levi MRN: [id number] DOB: May 13, 1951  Alechia Lezama is a 70 y.o. year old female who is a primary care patient of [name], FNP. I reached out to Candis Musa by phone today in response to a referral sent by Ms. Elwyn Reach patient's AWV (annual wellness visit) nurse, Joyce Copa. Boothe, LPN.      Ms. Guimont was given information about Chronic Care Management services today including:  CCM service includes personalized support from designated clinical staff supervised by her physician, including individualized plan of care and coordination with other care providers 24/7 contact phone numbers for assistance for urgent and routine care needs. Service will only be billed when office clinical staff spend 20 minutes or more in a month to coordinate care. Only one practitioner may furnish and bill the service in a calendar month. The patient may stop CCM services at any time (effective at the end of the month) by phone call to the office staff. The patient will be responsible for cost sharing (co-pay) of up to 20% of the service fee (after annual deductible is met).  Patient agreed to services and verbal consent obtained.   Follow up plan: Telephone appointment with care management team member scheduled for:12/14/2020  Noreene Larsson, [address], [place], [place] [address] Direct Dial: [phone] [email or web address] Website: [email or web address]

## 2020-12-14 ENCOUNTER — Ambulatory Visit (INDEPENDENT_AMBULATORY_CARE_PROVIDER_SITE_OTHER): Payer: Medicare HMO | Admitting: Licensed Clinical Social Worker

## 2020-12-14 DIAGNOSIS — N3941 Urge incontinence: Secondary | ICD-10-CM

## 2020-12-14 DIAGNOSIS — R42 Dizziness and giddiness: Secondary | ICD-10-CM | POA: Diagnosis not present

## 2020-12-14 DIAGNOSIS — M810 Age-related osteoporosis without current pathological fracture: Secondary | ICD-10-CM

## 2020-12-14 DIAGNOSIS — R030 Elevated blood-pressure reading, without diagnosis of hypertension: Secondary | ICD-10-CM

## 2020-12-14 NOTE — Patient Instructions (Signed)
Visit Information  PATIENT GOALS:  Goals Addressed             This Visit's Progress    Manage My Emotions; Manage anxiety issues and stress issues faced       Timeframe:  Short-Term Goal Priority:  High Progress: Not On Track Start Date:                    12/14/20         Expected End Date:                  03/03/21     Follow Up Date 01/24/21   Manage anxiety issues and stress issues faced    Why is this important?   When you are stressed, down or upset, your body reacts too.  For example, your blood pressure may get higher; you may have a headache or stomachache.  When your emotions get the best of you, your body's ability to fight off cold and flu gets weak.  These steps will help you manage your emotions.    Patient Self Care Activities:  Self administers medications as prescribed Attends all scheduled provider appointments Performs ADL's independently  Patient Coping Strengths:  Newborn  Patient Self Care Deficits:  Domestic stress issues Pain issues  Patient Goals:  - spend time or talk with others at least 2 to 3 times per week - practice relaxation or meditation daily - keep a calendar with appointment dates  Follow Up Plan: LCSW to call client on 01/24/21 to assess client needs     Norva Riffle.Quintana Canelo MSW, LCSW Licensed Clinical Social Worker [facility] Care Management [phone]
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
## 2020-12-30 ENCOUNTER — Ambulatory Visit: Payer: Medicare HMO | Admitting: Family

## 2021-01-24 ENCOUNTER — Ambulatory Visit (INDEPENDENT_AMBULATORY_CARE_PROVIDER_SITE_OTHER): Payer: Medicare HMO | Admitting: Licensed Clinical Social Worker

## 2021-01-24 DIAGNOSIS — R42 Dizziness and giddiness: Secondary | ICD-10-CM | POA: Diagnosis not present

## 2021-01-24 DIAGNOSIS — Z96649 Presence of unspecified artificial hip joint: Secondary | ICD-10-CM

## 2021-01-24 DIAGNOSIS — M810 Age-related osteoporosis without current pathological fracture: Secondary | ICD-10-CM | POA: Diagnosis not present

## 2021-01-24 DIAGNOSIS — N3941 Urge incontinence: Secondary | ICD-10-CM

## 2021-01-24 DIAGNOSIS — R030 Elevated blood-pressure reading, without diagnosis of hypertension: Secondary | ICD-10-CM
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
## 2021-01-24 NOTE — Patient Instructions (Signed)
Visit Information  PATIENT GOALS:  Goals Addressed             This Visit's Progress    Manage My Emotions; Manage anxiety issues and stress issues faced       Timeframe:  Short-Term Goal Priority:  High Progress: Not On Track Start Date:                    12/14/20         Expected End Date:                  03/03/21     Follow Up Date 03/04/21   Manage anxiety issues and stress issues faced    Why is this important?   When you are stressed, down or upset, your body reacts too.  For example, your blood pressure may get higher; you may have a headache or stomachache.  When your emotions get the best of you, your body's ability to fight off cold and flu gets weak.  These steps will help you manage your emotions.    Patient Self Care Activities:  Self administers medications as prescribed Attends all scheduled provider appointments Performs ADL's independently  Patient Coping Strengths:  [place]  Patient Self Care Deficits:  Domestic stress issues Pain issues  Patient Goals:  - spend time or talk with others at least 2 to 3 times per week - practice relaxation or meditation daily - keep a calendar with appointment dates  Follow Up Plan: LCSW to call client on 03/04/21 to assess client needs     Norva Riffle.Aurie Harroun MSW, LCSW Licensed Clinical Social Worker [facility] Care Management [phone]

## 2021-02-17 ENCOUNTER — Encounter: Payer: Self-pay | Admitting: Family

## 2021-02-17 ENCOUNTER — Ambulatory Visit (INDEPENDENT_AMBULATORY_CARE_PROVIDER_SITE_OTHER): Payer: Medicare HMO | Admitting: Family

## 2021-02-17 ENCOUNTER — Telehealth: Payer: Self-pay | Admitting: Family

## 2021-02-17 ENCOUNTER — Other Ambulatory Visit: Payer: Self-pay

## 2021-02-17 VITALS — BP 116/68 | HR 64 | Temp 97.5°F | Ht 68.0 in | Wt 130.8 lb

## 2021-02-17 DIAGNOSIS — F419 Anxiety disorder, unspecified: Secondary | ICD-10-CM | POA: Diagnosis not present

## 2021-02-17 DIAGNOSIS — R42 Dizziness and giddiness: Secondary | ICD-10-CM | POA: Diagnosis not present

## 2021-02-17 DIAGNOSIS — G4700 Insomnia, unspecified: Secondary | ICD-10-CM | POA: Diagnosis not present

## 2021-02-17 MED ORDER — TRAZODONE HCL 50 MG PO TABS: 50.0000 mg | ORAL_TABLET | Freq: Every day | ORAL | 1 refills | Status: DC

## 2021-02-17 NOTE — Progress Notes (Signed)
Subjective:    Patient ID: Adrienne Jacobs, female    DOB: 02-22-1951, 70 y.o.   MRN: [id number]  Chief Complaint  Patient presents with   Dizziness   Insomnia   PT presents to the office today with complaints of dizziness and tiredness after she eats carbs.   She is also complaining of insomnia that has been on going a year, but worsening. She states she takes melatonin that helps her fall asleep, but wakes up after 4 hours.   Dizziness  Insomnia Primary symptoms: sleep disturbance, difficulty falling asleep, frequent awakening, premature morning awakening.   The current episode started more than one year. The onset quality is gradual. The problem occurs intermittently. The symptoms are relieved by relaxation and medication. The treatment provided mild relief.  Anxiety Presents for initial visit. Symptoms include dizziness, excessive worry, insomnia and nervous/anxious behavior. Symptoms occur occasionally.       Review of Systems  Neurological:  Positive for dizziness.  Psychiatric/Behavioral:  Positive for sleep disturbance. The patient is nervous/anxious and has insomnia.   All other systems reviewed and are negative.     Objective:   Physical Exam Vitals reviewed.  Constitutional:      General: She is not in acute distress.    Appearance: She is well-developed.  HENT:     Head: Normocephalic and atraumatic.     Right Ear: Tympanic membrane normal.     Left Ear: Tympanic membrane normal.  Eyes:     Pupils: Pupils are equal, round, and reactive to light.  Neck:     Thyroid: No thyromegaly.  Cardiovascular:     Rate and Rhythm: Normal rate and regular rhythm.     Heart sounds: Normal heart sounds. No murmur heard. Pulmonary:     Effort: Pulmonary effort is normal. No respiratory distress.     Breath sounds: Normal breath sounds. No wheezing.  Abdominal:     General: Bowel sounds are normal. There is no distension.     Palpations: Abdomen is soft.     Tenderness:  There is no abdominal tenderness.  Musculoskeletal:        General: No tenderness. Normal range of motion.     Cervical back: Normal range of motion and neck supple.  Skin:    General: Skin is warm and dry.  Neurological:     Mental Status: She is alert and oriented to person, place, and time.     Cranial Nerves: No cranial nerve deficit.     Deep Tendon Reflexes: Reflexes are normal and symmetric.  Psychiatric:        Behavior: Behavior normal.        Thought Content: Thought content normal.        Judgment: Judgment normal.      BP 116/68   Pulse 64   Temp (!) 97.5 F (36.4 C) (Temporal)   Ht '5\' 8"'$  (1.727 m)   Wt 130 lb 12.8 oz (59.3 kg)   BMI 19.89 kg/m      Assessment & Plan:  Adrienne Jacobs comes in today with chief complaint of Dizziness and Insomnia   Diagnosis and orders addressed:  1. Insomnia, unspecified type Start Trazodone 50 mg today, she will try 25 mg the first night.  Sleep ritual discussed  - traZODone (DESYREL) 50 MG tablet; Take 1-2 tablets (50-100 mg total) by mouth at bedtime.  Dispense: 30 tablet; Refill: 1  2. Anxiety -Stress management  Does not want medication at this time  3. Dizziness     [name], FNP

## 2021-02-17 NOTE — Telephone Encounter (Signed)
Lmtcb.

## 2021-02-17 NOTE — Telephone Encounter (Signed)
Ok, lets see if sleeping at night can help with the naps. Hopefully, getting 8+ hours of sleep at night will help and not feel like taking a nap.

## 2021-02-17 NOTE — Patient Instructions (Signed)
Insomnia Insomnia is a sleep disorder that makes it difficult to fall asleep or stay asleep. Insomnia can cause fatigue, low energy, difficulty concentrating, moodswings, and poor performance at work or school. There are three different ways to classify insomnia: Difficulty falling asleep. Difficulty staying asleep. Waking up too early in the morning. Any type of insomnia can be long-term (chronic) or short-term (acute). Both are common. Short-term insomnia usually lasts for three months or less. Chronic insomnia occurs at least three times a week for longer than threemonths. What are the causes? Insomnia may be caused by another condition, situation, or substance, such as: Anxiety. Certain medicines. Gastroesophageal reflux disease (GERD) or other gastrointestinal conditions. Asthma or other breathing conditions. Restless legs syndrome, sleep apnea, or other sleep disorders. Chronic pain. Menopause. Stroke. Abuse of alcohol, tobacco, or illegal drugs. Mental health conditions, such as depression. Caffeine. Neurological disorders, such as Alzheimer's disease. An overactive thyroid (hyperthyroidism). Sometimes, the cause of insomnia may not be known. What increases the risk? Risk factors for insomnia include: Gender. Women are affected more often than men. Age. Insomnia is more common as you get older. Stress. Lack of exercise. Irregular work schedule or working night shifts. Traveling between different time zones. Certain medical and mental health conditions. What are the signs or symptoms? If you have insomnia, the main symptom is having trouble falling asleep or having trouble staying asleep. This may lead to other symptoms, such as: Feeling fatigued or having low energy. Feeling nervous about going to sleep. Not feeling rested in the morning. Having trouble concentrating. Feeling irritable, anxious, or depressed. How is this diagnosed? This condition may be diagnosed based  on: Your symptoms and medical history. Your health care provider may ask about: Your sleep habits. Any medical conditions you have. Your mental health. A physical exam. How is this treated? Treatment for insomnia depends on the cause. Treatment may focus on treating an underlying condition that is causing insomnia. Treatment may also include: Medicines to help you sleep. Counseling or therapy. Lifestyle adjustments to help you sleep better. Follow these instructions at home: Eating and drinking  Limit or avoid alcohol, caffeinated beverages, and cigarettes, especially close to bedtime. These can disrupt your sleep. Do not eat a large meal or eat spicy foods right before bedtime. This can lead to digestive discomfort that can make it hard for you to sleep.  Sleep habits  Keep a sleep diary to help you and your health care provider figure out what could be causing your insomnia. Write down: When you sleep. When you wake up during the night. How well you sleep. How rested you feel the next day. Any side effects of medicines you are taking. What you eat and drink. Make your bedroom a dark, comfortable place where it is easy to fall asleep. Put up shades or blackout curtains to block light from outside. Use a white noise machine to block noise. Keep the temperature cool. Limit screen use before bedtime. This includes: Watching TV. Using your smartphone, tablet, or computer. Stick to a routine that includes going to bed and waking up at the same times every day and night. This can help you fall asleep faster. Consider making a quiet activity, such as reading, part of your nighttime routine. Try to avoid taking naps during the day so that you sleep better at night. Get out of bed if you are still awake after 15 minutes of trying to sleep. Keep the lights down, but try reading or doing a quiet   activity. When you feel sleepy, go back to bed.  General instructions Take over-the-counter  and prescription medicines only as told by your health care provider. Exercise regularly, as told by your health care provider. Avoid exercise starting several hours before bedtime. Use relaxation techniques to manage stress. Ask your health care provider to suggest some techniques that may work well for you. These may include: Breathing exercises. Routines to release muscle tension. Visualizing peaceful scenes. Make sure that you drive carefully. Avoid driving if you feel very sleepy. Keep all follow-up visits as told by your health care provider. This is important. Contact a health care provider if: You are tired throughout the day. You have trouble in your daily routine due to sleepiness. You continue to have sleep problems, or your sleep problems get worse. Get help right away if: You have serious thoughts about hurting yourself or someone else. If you ever feel like you may hurt yourself or others, or have thoughts about taking your own life, get help right away. You can go to your nearest emergency department or call: Your local emergency services (911 in the [place] A suicide crisis helpline, such as the National Suicide Prevention Lifeline at 1-800-273-8255. This is open 24 hours a day. Summary Insomnia is a sleep disorder that makes it difficult to fall asleep or stay asleep. Insomnia can be long-term (chronic) or short-term (acute). Treatment for insomnia depends on the cause. Treatment may focus on treating an underlying condition that is causing insomnia. Keep a sleep diary to help you and your health care provider figure out what could be causing your insomnia. This information is not intended to replace advice given to you by your health care provider. Make sure you discuss any questions you have with your healthcare provider. Document Revised: 04/01/2020 Document Reviewed: 04/01/2020 Elsevier Patient Education  2022 Elsevier Inc.  

## 2021-02-18 ENCOUNTER — Other Ambulatory Visit: Payer: Self-pay | Admitting: Family

## 2021-02-18 DIAGNOSIS — Z1231 Encounter for screening mammogram for malignant neoplasm of breast: Secondary | ICD-10-CM

## 2021-02-24 ENCOUNTER — Other Ambulatory Visit: Payer: Self-pay | Admitting: Family

## 2021-02-24 DIAGNOSIS — G4700 Insomnia, unspecified: Secondary | ICD-10-CM

## 2021-03-02 NOTE — Telephone Encounter (Signed)
Unable to reach patient, encounter closed °

## 2021-03-04 ENCOUNTER — Ambulatory Visit (INDEPENDENT_AMBULATORY_CARE_PROVIDER_SITE_OTHER): Payer: Medicare HMO | Admitting: Licensed Clinical Social Worker

## 2021-03-04 DIAGNOSIS — R030 Elevated blood-pressure reading, without diagnosis of hypertension: Secondary | ICD-10-CM

## 2021-03-04 DIAGNOSIS — Z96649 Presence of unspecified artificial hip joint: Secondary | ICD-10-CM

## 2021-03-04 DIAGNOSIS — N3941 Urge incontinence: Secondary | ICD-10-CM | POA: Diagnosis not present

## 2021-03-04 DIAGNOSIS — M810 Age-related osteoporosis without current pathological fracture: Secondary | ICD-10-CM | POA: Diagnosis not present

## 2021-03-04 DIAGNOSIS — R42 Dizziness and giddiness: Secondary | ICD-10-CM

## 2021-03-04 NOTE — Patient Instructions (Signed)
Visit Information  PATIENT GOALS:  Goals Addressed             This Visit's Progress    Manage My Emotions; Manage anxiety issues and stress issues faced       Timeframe:  Short-Term Goal Priority:  Medium Progress: Not On Track Start Date:                03/04/21           Expected End Date:                06/02/21     Follow Up Date 04/21/21 at 3:00 PM   Manage anxiety issues and stress issues faced    Why is this important?   When you are stressed, down or upset, your body reacts too.  For example, your blood pressure may get higher; you may have a headache or stomachache.  When your emotions get the best of you, your body's ability to fight off cold and flu gets weak.  These steps will help you manage your emotions.    Patient Self Care Activities:  Self administers medications as prescribed Attends all scheduled provider appointments Performs ADL's independently  Patient Coping Strengths:  [place]  Patient Self Care Deficits:  Domestic stress issues Pain issues  Patient Goals:  - spend time or talk with others at least 2 to 3 times per week - practice relaxation or meditation daily - keep a calendar with appointment dates  Follow Up Plan: LCSW to call client on 04/21/21 at 3:00 PM to assess client needs     Norva Riffle.Suleiman Finigan MSW, LCSW Licensed Clinical Social Worker [facility] Care Management [phone]
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
## 2021-04-12 ENCOUNTER — Ambulatory Visit: Payer: Medicare HMO

## 2021-04-18 ENCOUNTER — Ambulatory Visit: Payer: Medicare HMO

## 2021-04-21 ENCOUNTER — Telehealth: Payer: Medicare HMO

## 2021-04-26 ENCOUNTER — Ambulatory Visit: Payer: Medicare HMO

## 2021-05-02 ENCOUNTER — Ambulatory Visit: Payer: Medicare HMO

## 2021-05-24 ENCOUNTER — Telehealth: Payer: Medicare HMO

## 2021-07-20 ENCOUNTER — Telehealth: Payer: Medicare HMO

## 2021-11-08 ENCOUNTER — Ambulatory Visit (INDEPENDENT_AMBULATORY_CARE_PROVIDER_SITE_OTHER): Payer: Medicare Other | Admitting: Nurse Practitioner

## 2021-11-08 ENCOUNTER — Encounter: Payer: Self-pay | Admitting: Nurse Practitioner

## 2021-11-08 VITALS — BP 108/59 | HR 47 | Temp 98.3°F | Ht 68.0 in | Wt 131.6 lb

## 2021-11-08 DIAGNOSIS — W57XXXA Bitten or stung by nonvenomous insect and other nonvenomous arthropods, initial encounter: Secondary | ICD-10-CM

## 2021-11-08 DIAGNOSIS — S40862A Insect bite (nonvenomous) of left upper arm, initial encounter: Secondary | ICD-10-CM | POA: Diagnosis not present

## 2021-11-08 MED ORDER — DOXYCYCLINE HYCLATE 100 MG PO TABS: 100.0000 mg | ORAL_TABLET | Freq: Two times a day (BID) | ORAL | 0 refills | Status: DC

## 2021-11-08 NOTE — Patient Instructions (Signed)
Tick Bite Information, Adult Ticks are insects that draw blood for food. Most ticks live in shrubs and grassy and wooded areas. They climb onto people and animals that brush against the leaves and grasses that they rest on. Then they bite, attaching themselves to the skin. Most ticks are harmless, but some ticks may carry germs that can spread to a person through a bite and cause a disease. To reduce your risk of getting a disease from a tick bite, make sure you: Take steps to prevent tick bites. Check for ticks after being outdoors where ticks live. Watch for symptoms of disease if a tick attached to you or if you suspect a tick bite. How can I prevent tick bites? Take these steps to help prevent tick bites when you go outdoors in an area where ticks live: Use insect repellent Use insect repellent that has DEET (20% or higher), picaridin, or IR3535 in it. Follow the instructions on the label. Use these products on: Bare skin. The top of your boots. Your pant legs. Your sleeve cuffs. For insect repellent that contains permethrin, follow the instructions on the label. Use these products on: Clothing. Boots. Outdoor gear. Tents. When you are outside Wear protective clothing. Long sleeves and long pants offer the best protection from ticks. Wear light-colored clothing so you can see ticks more easily. Tuck your pant legs into your socks. If you go walking on a trail, stay in the middle of the trail so your skin, hair, and clothing do not touch the bushes. Avoid walking through areas with long grass. Check for ticks on your clothing, hair, and skin often while you are outside, and check again before you go inside. Make sure to check the scalp, neck, armpits, waist, groin, and joint areas. These are the spots where ticks attach themselves most often. When you go indoors Check your clothing for ticks. Tumble dry clothes in a dryer on high heat for at least 10 minutes. If clothes are damp,  additional time may be needed. If clothes require washing, use hot water. Examine gear and pets. Shower soon after being outdoors. Check your body for ticks. Conduct a full body check using a mirror. What is the proper way to remove a tick? If you find a tick on your body, remove it as soon as possible. Removing a tick sooner can prevent germs from passing to your body. Do not remove the tick with your bare fingers. To remove a tick that is crawling on your skin but has not bitten, use either of these methods: Go outdoors and brush the tick off. Remove the tick with tape or a lint roller. To remove a tick that is attached to your skin: Wash your hands. If you have latex gloves, put them on. Use fine-tipped tweezers, curved forceps, or a tick-removal tool to gently grasp the tick as close to your skin and the tick's head as possible. Gently pull with a steady, upward, even pressure until the tick lets go. When removing the tick: Take care to keep the tick's head attached to its body. Do not twist or jerk the tick. This can make the tick's head or mouth parts break off and remain in the skin. Do not squeeze or crush the tick's body. This could force disease-carrying fluids from the tick into your body. Do not try to remove a tick with heat, alcohol, petroleum jelly, or fingernail polish. Using these methods can cause the tick to salivate and regurgitate into your bloodstream,   increasing your risk of getting a disease. What should I do after removing a tick? Dispose of the tick. Do not crush a tick with your fingers. Clean the bite area and your hands with soap and water, rubbing alcohol, or an iodine scrub. If an antiseptic cream or ointment is available, apply a small amount to the bite site. Wash and disinfect any instruments that you used to remove the tick. How should I dispose of a tick? To dispose of a live tick, use one of these methods: Place it in rubbing alcohol. Place it in a sealed  bag or container. Wrap it tightly in tape. Flush it down the toilet. Contact a health care provider if: You have symptoms of a disease after a tick bite. Symptoms of a tick-borne disease can occur from moments after the tick bites to 30 days after a tick is removed. Symptoms include: Fever or chills. Any of these signs in the bite area: A red rash that makes a circle (bull's-eye rash) in the bite area. Redness and swelling. Headache. Muscle, joint, or bone pain. Abnormal tiredness. Numbness in your legs or difficulty walking or moving your legs. Tender, swollen lymph glands. A part of a tick breaks off and gets stuck in your skin. Get help right away if: You are not able to remove a tick. You experience muscle weakness or paralysis. Your symptoms get worse or you experience new symptoms. You find an engorged tick on your skin and you are in an area where disease from ticks is a high risk. Summary Ticks may carry germs that can spread to a person through a bite and cause a disease. Wear protective clothing and use insect repellent to prevent tick bites. Follow the instructions on the label. If you find a tick on your body, remove it as soon as possible. If the tick is attached, do not try to remove with heat, alcohol, petroleum jelly, or fingernail polish. Remove the attached tick using fine-tipped tweezers, curved forceps, or a tick-removal tool. Gently pull with steady, upward, even pressure until the tick lets go. Do not twist or jerk the tick. Do not squeeze or crush the tick's body. If you have symptoms of a disease after being bitten by a tick, contact a health care provider. This information is not intended to replace advice given to you by your health care provider. Make sure you discuss any questions you have with your health care provider. Document Revised: 05/19/2019 Document Reviewed: 05/19/2019 Elsevier Patient Education  2023 Elsevier Inc.  

## 2021-11-08 NOTE — Progress Notes (Signed)
   Subjective:    Patient ID: Lavaya Defreitas, female    DOB: 1950/10/18, 71 y.o.   MRN: [id number]   Chief Complaint: Insect Bite (Tick - w/ redness around it)   HPI Patient gets frequent tick bites. She pulled a tick off of her left shoulder 3 days ago, area is getting larger.     Review of Systems  Constitutional:  Negative for chills and fever.  HENT: Negative.    Respiratory: Negative.    Cardiovascular: Negative.   Genitourinary: Negative.   Neurological:  Negative for headaches.  All other systems reviewed and are negative.     Objective:   Physical Exam Vitals reviewed.  Constitutional:      Appearance: Normal appearance.  Cardiovascular:     Rate and Rhythm: Normal rate and regular rhythm.     Heart sounds: Normal heart sounds.  Pulmonary:     Effort: Pulmonary effort is normal.     Breath sounds: Normal breath sounds.  Skin:    General: Skin is warm.     Comments: 1cm lesion draining yellowish excudate on left shoulder  Neurological:     General: No focal deficit present.     Mental Status: She is alert and oriented to person, place, and time.  Psychiatric:        Mood and Affect: Mood normal.        Behavior: Behavior normal.   BP (!) 108/59   Pulse (!) 47   Temp 98.3 F (36.8 C) (Oral)   Ht '5\' 8"'$  (1.727 m)   Wt 131 lb 9.6 oz (59.7 kg)   SpO2 97%   BMI 20.01 kg/m         Assessment & Plan:  Paxton Kanaan in today with chief complaint of Insect Bite (Tick - w/ redness around it)   1. Tick bite of left upper arm, initial encounter Clean area with anti bacterial soap BID Do not pick or scratch at area Eat before taking antibiotics  Meds ordered this encounter  Medications   doxycycline (VIBRA-TABS) 100 MG tablet    Sig: Take 1 tablet (100 mg total) by mouth 2 (two) times daily. 1 po bid    Dispense:  28 tablet    Refill:  0    Order Specific Question:   Supervising Provider    Answer:   Caryl Pina A [[id number]]     The above  assessment and management plan was discussed with the patient. The patient verbalized understanding of and has agreed to the management plan. Patient is aware to call the clinic if symptoms persist or worsen. Patient is aware when to return to the clinic for a follow-up visit. Patient educated on when it is appropriate to go to the emergency department.   Mary-Margaret Hassell Done, FNP

## 2021-11-28 ENCOUNTER — Ambulatory Visit: Payer: Medicare Other | Admitting: Family

## 2021-12-16 ENCOUNTER — Ambulatory Visit: Payer: Medicare Other | Admitting: Family

## 2021-12-23 ENCOUNTER — Ambulatory Visit (INDEPENDENT_AMBULATORY_CARE_PROVIDER_SITE_OTHER): Payer: Medicare Other | Admitting: Family

## 2021-12-23 ENCOUNTER — Encounter: Payer: Self-pay | Admitting: Family

## 2021-12-23 VITALS — BP 111/61 | HR 67 | Temp 97.9°F | Ht 68.0 in | Wt 130.2 lb

## 2021-12-23 DIAGNOSIS — G43109 Migraine with aura, not intractable, without status migrainosus: Secondary | ICD-10-CM

## 2021-12-23 DIAGNOSIS — Z136 Encounter for screening for cardiovascular disorders: Secondary | ICD-10-CM | POA: Diagnosis not present

## 2021-12-23 DIAGNOSIS — Z0001 Encounter for general adult medical examination with abnormal findings: Secondary | ICD-10-CM | POA: Diagnosis not present

## 2021-12-23 DIAGNOSIS — N952 Postmenopausal atrophic vaginitis: Secondary | ICD-10-CM | POA: Insufficient documentation

## 2021-12-23 DIAGNOSIS — R42 Dizziness and giddiness: Secondary | ICD-10-CM | POA: Diagnosis not present

## 2021-12-23 DIAGNOSIS — Z0000 Encounter for general adult medical examination without abnormal findings: Secondary | ICD-10-CM | POA: Diagnosis not present

## 2021-12-23 DIAGNOSIS — R6889 Other general symptoms and signs: Secondary | ICD-10-CM | POA: Diagnosis not present

## 2021-12-23 DIAGNOSIS — M810 Age-related osteoporosis without current pathological fracture: Secondary | ICD-10-CM | POA: Diagnosis not present

## 2021-12-23 DIAGNOSIS — R5383 Other fatigue: Secondary | ICD-10-CM

## 2021-12-23 NOTE — Patient Instructions (Addendum)

## 2021-12-23 NOTE — Progress Notes (Signed)
Subjective:    Patient ID: Adrienne Jacobs, female    DOB: 1951-01-30, 71 y.o.   MRN: [id number]  Chief Complaint  Patient presents with   Medical Management of Chronic Issues    Discuss medications and blood sugars   PT presents to the office today for CPE.  She reports she has had some "blood sugar" issues. States when she eats carbs she is having fatigue right after eating. Eating salad or just protein does not cause fatigue.   She has osteoporosis and takes calcium and vit D. Reports she walks and swims weekly.   She uses estrace vaginal cream for atrophic vagitis.  Reports she gets ocular migraines that lasts 45 mins to hour. Reports bright sunlight triggers and states during the summer time she can get these 2 times a month.   Complaining intermittent vertigo. Also has constant  tinnitus.  Dizziness This is a recurrent problem. The current episode started more than 1 month ago. The problem occurs intermittently.      Review of Systems  Neurological:  Positive for dizziness.  All other systems reviewed and are negative.   Family History  Problem Relation Age of Onset   Other Mother        liver failure   Stroke Father    Hypertension Father    Hypertension Sister    Diabetes Maternal Uncle    Social History   Socioeconomic History   Marital status: Divorced    Spouse name: Jeneen Rinks   Number of children: 0   Years of education: Not on file   Highest education level: High school graduate  Occupational History   Occupation: RETIRED  Tobacco Use   Smoking status: Never   Smokeless tobacco: Never  Vaping Use   Vaping Use: Never used  Substance and Sexual Activity   Alcohol use: No   Drug use: No   Sexual activity: Not on file  Other Topics Concern   Not on file  Social History Narrative   Lives with ex husband   Caffeine- coffee 2 c   Social Determinants of Health   Financial Resource Strain: Not on file  Food Insecurity: No Food Insecurity (03/04/2021)    Hunger Vital Sign    Worried About Running Out of Food in the Last Year: Never true    Ran Out of Food in the Last Year: Never true  Transportation Needs: Not on file  Physical Activity: Not on file  Stress: Stress Concern Present (03/04/2021)   [employer] [place]    Feeling of Stress : Rather much  Social Connections: Not on file       Objective:   Physical Exam Vitals reviewed.  Constitutional:      General: She is not in acute distress.    Appearance: She is well-developed.  HENT:     Head: Normocephalic and atraumatic.     Right Ear: Tympanic membrane normal.     Left Ear: Tympanic membrane normal.  Eyes:     Pupils: Pupils are equal, round, and reactive to light.  Neck:     Thyroid: No thyromegaly.  Cardiovascular:     Rate and Rhythm: Normal rate and regular rhythm.     Heart sounds: Normal heart sounds. No murmur heard. Pulmonary:     Effort: Pulmonary effort is normal. No respiratory distress.     Breath sounds: Normal breath sounds. No wheezing.  Abdominal:     General: Bowel sounds are normal.  There is no distension.     Palpations: Abdomen is soft.     Tenderness: There is no abdominal tenderness.  Musculoskeletal:        General: No tenderness. Normal range of motion.     Cervical back: Normal range of motion and neck supple.  Skin:    General: Skin is warm and dry.  Neurological:     Mental Status: She is alert and oriented to person, place, and time.     Cranial Nerves: No cranial nerve deficit.     Deep Tendon Reflexes: Reflexes are normal and symmetric.  Psychiatric:        Behavior: Behavior normal.        Thought Content: Thought content normal.        Judgment: Judgment normal.      BP 111/61   Pulse 67   Temp 97.9 F (36.6 C)   Ht '5\' 8"'  (1.727 m)   Wt 130 lb 3.2 oz (59.1 kg)   SpO2 97%   BMI 19.80 kg/m      Assessment & Plan:  Adrienne Jacobs comes in today with chief  complaint of Medical Management of Chronic Issues (Discuss medications and blood sugars)   Diagnosis and orders addressed:  1. Osteoporosis without pathological fracture - CMP14+EGFR - CBC with Differential/Platelet  2. Annual physical exam - CMP14+EGFR - CBC with Differential/Platelet - Lipid panel - TSH - Iron, TIBC and Ferritin Panel  3. Other fatigue - CMP14+EGFR - CBC with Differential/Platelet - Iron, TIBC and Ferritin Panel  4. Atrophic vaginitis - CMP14+EGFR - CBC with Differential/Platelet  5. Vertigo - CMP14+EGFR - CBC with Differential/Platelet  6. Ocular migraine If symptoms continue may need CT head - CMP14+EGFR - CBC with Differential/Platelet   Labs pending Health Maintenance reviewed Diet and exercise encouraged  Follow up plan: 1 year    [name], FNP

## 2021-12-24 LAB — CMP14+EGFR
ALT: 16 IU/L (ref 0–32)
AST: 23 IU/L (ref 0–40)
Albumin/Globulin Ratio: 2.1 (ref 1.2–2.2)
Albumin: 4 g/dL (ref 3.8–4.8)
Alkaline Phosphatase: 63 IU/L (ref 44–121)
BUN/Creatinine Ratio: 18 (ref 12–28)
BUN: 15 mg/dL (ref 8–27)
Bilirubin Total: 0.3 mg/dL (ref 0.0–1.2)
CO2: 23 mmol/L (ref 20–29)
Calcium: 9.6 mg/dL (ref 8.7–10.3)
Chloride: 100 mmol/L (ref 96–106)
Creatinine, Ser: 0.83 mg/dL (ref 0.57–1.00)
Globulin, Total: 1.9 g/dL (ref 1.5–4.5)
Glucose: 91 mg/dL (ref 70–99)
Potassium: 4 mmol/L (ref 3.5–5.2)
Sodium: 136 mmol/L (ref 134–144)
Total Protein: 5.9 g/dL — ABNORMAL LOW (ref 6.0–8.5)
eGFR: 75 mL/min/{1.73_m2} (ref >59)

## 2021-12-24 LAB — LIPID PANEL
Chol/HDL Ratio: 2.5 ratio (ref 0.0–4.4)
Cholesterol, Total: 177 mg/dL (ref 100–199)
HDL: 71 mg/dL (ref >39)
LDL Chol Calc (NIH): 92 mg/dL (ref 0–99)
Triglycerides: 74 mg/dL (ref 0–149)
VLDL Cholesterol Cal: 14 mg/dL (ref 5–40)

## 2021-12-24 LAB — CBC WITH DIFFERENTIAL/PLATELET
Basophils Absolute: 0 10*3/uL (ref 0.0–0.2)
Basos: 0 %
EOS (ABSOLUTE): 0.1 10*3/uL (ref 0.0–0.4)
Eos: 2 %
Hematocrit: 36.3 % (ref 34.0–46.6)
Hemoglobin: 12.4 g/dL (ref 11.1–15.9)
Immature Grans (Abs): 0 10*3/uL (ref 0.0–0.1)
Immature Granulocytes: 0 %
Lymphocytes Absolute: 1.3 10*3/uL (ref 0.7–3.1)
Lymphs: 27 %
MCH: 31.7 pg (ref 26.6–33.0)
MCHC: 34.2 g/dL (ref 31.5–35.7)
MCV: 93 fL (ref 79–97)
Monocytes Absolute: 0.4 10*3/uL (ref 0.1–0.9)
Monocytes: 7 %
Neutrophils Absolute: 3.2 10*3/uL (ref 1.4–7.0)
Neutrophils: 64 %
Platelets: 198 10*3/uL (ref 150–450)
RBC: 3.91 x10E6/uL (ref 3.77–5.28)
RDW: 13 % (ref 11.7–15.4)
WBC: 5 10*3/uL (ref 3.4–10.8)

## 2021-12-24 LAB — IRON,TIBC AND FERRITIN PANEL
Ferritin: 38 ng/mL (ref 15–150)
Iron Saturation: 31 % (ref 15–55)
Iron: 106 ug/dL (ref 27–139)
Total Iron Binding Capacity: 344 ug/dL (ref 250–450)
UIBC: 238 ug/dL (ref 118–369)

## 2021-12-24 LAB — TSH: TSH: 2.6 u[IU]/mL (ref 0.450–4.500)

## 2021-12-26 ENCOUNTER — Other Ambulatory Visit: Payer: Self-pay | Admitting: Family

## 2021-12-26 MED ORDER — ROSUVASTATIN CALCIUM 5 MG PO TABS: 5.0000 mg | ORAL_TABLET | Freq: Every day | ORAL | 3 refills | Status: DC

## 2022-04-05 ENCOUNTER — Ambulatory Visit (INDEPENDENT_AMBULATORY_CARE_PROVIDER_SITE_OTHER): Payer: Medicare Other

## 2022-04-05 DIAGNOSIS — Z23 Encounter for immunization: Secondary | ICD-10-CM

## 2022-05-02 ENCOUNTER — Other Ambulatory Visit: Payer: Self-pay | Admitting: Family

## 2022-05-02 ENCOUNTER — Ambulatory Visit (INDEPENDENT_AMBULATORY_CARE_PROVIDER_SITE_OTHER): Payer: Medicare Other

## 2022-05-02 DIAGNOSIS — Z780 Asymptomatic menopausal state: Secondary | ICD-10-CM | POA: Diagnosis not present

## 2022-05-02 DIAGNOSIS — M810 Age-related osteoporosis without current pathological fracture: Secondary | ICD-10-CM | POA: Diagnosis not present

## 2022-05-02 MED ORDER — ALENDRONATE SODIUM 70 MG PO TABS: 70.0000 mg | ORAL_TABLET | ORAL | 11 refills | Status: DC

## 2022-06-01 ENCOUNTER — Other Ambulatory Visit: Payer: Self-pay | Admitting: Family

## 2022-06-01 DIAGNOSIS — N952 Postmenopausal atrophic vaginitis: Secondary | ICD-10-CM

## 2022-06-16 DIAGNOSIS — L570 Actinic keratosis: Secondary | ICD-10-CM | POA: Diagnosis not present

## 2022-06-16 DIAGNOSIS — L538 Other specified erythematous conditions: Secondary | ICD-10-CM | POA: Diagnosis not present

## 2022-06-16 DIAGNOSIS — D485 Neoplasm of uncertain behavior of skin: Secondary | ICD-10-CM | POA: Diagnosis not present

## 2022-07-13 DIAGNOSIS — L821 Other seborrheic keratosis: Secondary | ICD-10-CM | POA: Diagnosis not present

## 2022-07-13 DIAGNOSIS — C44329 Squamous cell carcinoma of skin of other parts of face: Secondary | ICD-10-CM | POA: Diagnosis not present

## 2022-07-13 DIAGNOSIS — D225 Melanocytic nevi of trunk: Secondary | ICD-10-CM | POA: Diagnosis not present

## 2022-07-13 DIAGNOSIS — L814 Other melanin hyperpigmentation: Secondary | ICD-10-CM | POA: Diagnosis not present

## 2022-07-13 DIAGNOSIS — L718 Other rosacea: Secondary | ICD-10-CM | POA: Diagnosis not present

## 2022-07-13 DIAGNOSIS — L218 Other seborrheic dermatitis: Secondary | ICD-10-CM | POA: Diagnosis not present

## 2022-07-13 DIAGNOSIS — D485 Neoplasm of uncertain behavior of skin: Secondary | ICD-10-CM | POA: Diagnosis not present

## 2022-08-01 ENCOUNTER — Encounter: Payer: Self-pay | Admitting: Family

## 2022-08-03 DIAGNOSIS — H903 Sensorineural hearing loss, bilateral: Secondary | ICD-10-CM | POA: Diagnosis not present

## 2022-08-03 DIAGNOSIS — H6122 Impacted cerumen, left ear: Secondary | ICD-10-CM | POA: Diagnosis not present

## 2022-08-21 ENCOUNTER — Telehealth: Payer: Self-pay | Admitting: Family

## 2022-08-21 NOTE — Telephone Encounter (Signed)
Called patient to schedule Medicare Annual Wellness Visit (AWV). No voicemail available to leave a message.  Last date of AWV: : 12/10/2020   Please schedule an appointment at any time with either Mickel Baas or [place], NHA's. .  If any questions, please contact me at [phone].  Thank you,  Colletta Maryland,  [place] [facility] Direct Dial ??[phone]

## 2022-08-24 DIAGNOSIS — Z96642 Presence of left artificial hip joint: Secondary | ICD-10-CM | POA: Diagnosis not present

## 2022-08-24 DIAGNOSIS — M25552 Pain in left hip: Secondary | ICD-10-CM | POA: Diagnosis not present

## 2022-08-24 DIAGNOSIS — M545 Low back pain, unspecified: Secondary | ICD-10-CM | POA: Diagnosis not present

## 2022-08-25 ENCOUNTER — Ambulatory Visit (INDEPENDENT_AMBULATORY_CARE_PROVIDER_SITE_OTHER): Payer: Medicare Other | Admitting: Family

## 2022-08-25 ENCOUNTER — Encounter: Payer: Self-pay | Admitting: Family

## 2022-08-25 VITALS — BP 131/78 | HR 61 | Temp 97.1°F | Ht 68.0 in | Wt 131.6 lb

## 2022-08-25 DIAGNOSIS — H01004 Unspecified blepharitis left upper eyelid: Secondary | ICD-10-CM | POA: Diagnosis not present

## 2022-08-25 MED ORDER — BACITRACIN-POLYMYXIN B 500-10000 UNIT/GM OP OINT: 1.0000 | TOPICAL_OINTMENT | Freq: Two times a day (BID) | OPHTHALMIC | 0 refills | Status: DC

## 2022-08-25 NOTE — Progress Notes (Signed)
Subjective:    Patient ID: Adrienne Jacobs, female    DOB: Nov 01, 1950, 72 y.o.   MRN: [id number]  Chief Complaint  Patient presents with   Eye Problem    Both eyes    Pt presents to the office today with bilateral eye erythemas and itching. Reports it started in left eye.  Eye Problem  The left eye is affected. This is a new problem. The current episode started in the past 7 days. The problem occurs constantly. The problem has been gradually worsening. There was no injury mechanism. The pain is at a severity of 3/10. The pain is mild. Associated symptoms include blurred vision, eye redness, a foreign body sensation and itching. Pertinent negatives include no eye discharge, double vision, fever or photophobia. Adrienne Jacobs has tried eye drops for the symptoms. The treatment provided mild relief.      Review of Systems  Constitutional:  Negative for fever.  Eyes:  Positive for blurred vision, redness and itching. Negative for double vision, photophobia and discharge.  All other systems reviewed and are negative.      Objective:   Physical Exam Vitals reviewed.  Constitutional:      General: Adrienne Jacobs is not in acute distress.    Appearance: Adrienne Jacobs is well-developed.  HENT:     Head: Normocephalic and atraumatic.     Right Ear: Tympanic membrane normal.     Left Ear: Tympanic membrane normal.  Eyes:     Pupils: Pupils are equal, round, and reactive to light.     Comments: Left eye lid erythemas and swelling  Neck:     Thyroid: No thyromegaly.  Cardiovascular:     Rate and Rhythm: Normal rate and regular rhythm.     Heart sounds: Normal heart sounds. No murmur heard. Pulmonary:     Effort: Pulmonary effort is normal. No respiratory distress.     Breath sounds: Normal breath sounds. No wheezing.  Abdominal:     General: Bowel sounds are normal. There is no distension.     Palpations: Abdomen is soft.     Tenderness: There is no abdominal tenderness.  Musculoskeletal:        General: No  tenderness. Normal range of motion.     Cervical back: Normal range of motion and neck supple.  Skin:    General: Skin is warm and dry.  Neurological:     Mental Status: Adrienne Jacobs is alert and oriented to person, place, and time.     Cranial Nerves: No cranial nerve deficit.     Deep Tendon Reflexes: Reflexes are normal and symmetric.  Psychiatric:        Behavior: Behavior normal.        Thought Content: Thought content normal.        Judgment: Judgment normal.       BP 131/78   Pulse 61   Temp (!) 97.1 F (36.2 C) (Temporal)   Ht 5\' 8"  (1.727 m)   Wt 131 lb 9.6 oz (59.7 kg)   SpO2 97%   BMI 20.01 kg/m      Assessment & Plan:   Adrienne Jacobs comes in today with chief complaint of Eye Problem (Both eyes )   Diagnosis and orders addressed:  1. Blepharitis of left upper eyelid, unspecified type Cool compresses Good hand hygiene  Avoid rubbing eye Follow up if symptoms worsen or do not improve  - bacitracin-polymyxin b (POLYSPORIN) ophthalmic ointment; Place 1 Application into both eyes 2 (two) times daily.  Dispense:  3.5 g; Refill: 0   [name], FNP

## 2022-08-25 NOTE — Patient Instructions (Signed)
Blepharitis ?Blepharitis refers to inflammation of the eyelids. It is a common condition and can cause dryness or grittiness in the eyes. Other symptoms may include: ?Reddish, scaly skin around the scalp and eyebrows. ?Burning or itching of the eyelids. ?Eye discharge at night that causes the eyelashes to stick together in the morning. ?Eyelashes that fall out. ?Redness of the eyes. ?Sensitivity to light. ?Follow these instructions at home: ?Pay attention to any changes in how your eyes look or feel. Tell your health care provider about any changes. Follow these instructions to help with your condition. ?Keeping clean ?Wash your hands often with soap and water for at least 20 seconds. ?Clean your eyes and wash the edges of your eyelids with diluted baby shampoo or commercial eyelid wipes. Do this 2 or more times a day. ?Wash your face and eyebrows at least once a day. ?Use a clean towel each time you dry your eyelids. Do not use this towel to clean or dry other areas of your body. Do not share your towel with anyone. ?General instructions ?Avoid wearing makeup until you get better. Do not share makeup with anyone. ?Avoid rubbing your eyes. ?Use warm compresses on the eyes for 5-10 minutes. Do this 1 or 2 times a day, or as told by your health care provider. You can use warm water on a towel, but a microwaveable heating pad often stays warm longer. The pad should be very warm but not hot enough to burn the skin. ?If you were prescribed an antibiotic ointment or steroid drops, apply or use the medicine as told by your health care provider. Do not stop using the medicine even if you feel better. ?Keep all follow-up visits. This is important. ?Contact a health care provider if: ?Your eyelids feel hot. ?You have blisters or a rash on your eyelids. ?The inflammation gets worse or does not go away in 2-4 days. ?Get help right away if: ?You have pain or redness that gets worse or spreads to other parts of your face. ?Your  vision changes. ?You have pain when looking at lights or moving objects. ?You have a fever. ?Summary ?Blepharitis refers to inflammation of the eyelids. It can cause dryness and grittiness in the eyes. ?Pay attention to any changes in how your eyes look or feel. Tell your health care provider about any changes. ?Follow home care instructions as told by your health care provider. Wash your hands often with soap and water for at least 20 seconds. Avoid wearing makeup. Do not rub your eyes. ?If you were prescribed an antibiotic ointment or steroid drops, apply or use the medicine as told by your health care provider. ?Get help right away if you have a fever, vision changes, pain or redness that gets worse or spreads to other parts of your face, or pain when looking at lights or moving objects. ?This information is not intended to replace advice given to you by your health care provider. Make sure you discuss any questions you have with your health care provider. ?Document Revised: 06/23/2020 Document Reviewed: 06/23/2020 ?Elsevier Patient Education ? 2023 [employer] Inc[employer] ? ?

## 2022-09-25 ENCOUNTER — Telehealth: Payer: Self-pay | Admitting: Family

## 2022-09-25 NOTE — Telephone Encounter (Signed)
Called patient to schedule Medicare Annual Wellness Visit (AWV). Left message for patient to call back and schedule Medicare Annual Wellness Visit (AWV).  Last date of AWV: 12/10/2020   Please schedule an appointment at any time with either Vernona Rieger or Bryant, [facility]. .  If any questions, please contact me at [phone].  Thank you,  Judeth Cornfield,  AMB Clinical Support [facility] AWV Program Direct Dial ??[phone]

## 2022-09-27 ENCOUNTER — Telehealth: Payer: Self-pay | Admitting: Family

## 2022-09-28 NOTE — Telephone Encounter (Signed)
No need to start antibiotic for dental appointment.

## 2022-09-28 NOTE — Telephone Encounter (Signed)
Patient aware and verbalized understanding. °

## 2022-09-29 DIAGNOSIS — H25812 Combined forms of age-related cataract, left eye: Secondary | ICD-10-CM | POA: Diagnosis not present

## 2022-09-29 DIAGNOSIS — H43813 Vitreous degeneration, bilateral: Secondary | ICD-10-CM | POA: Diagnosis not present

## 2022-09-29 DIAGNOSIS — G43109 Migraine with aura, not intractable, without status migrainosus: Secondary | ICD-10-CM | POA: Diagnosis not present

## 2022-09-29 DIAGNOSIS — H25813 Combined forms of age-related cataract, bilateral: Secondary | ICD-10-CM | POA: Diagnosis not present

## 2022-09-29 DIAGNOSIS — H25811 Combined forms of age-related cataract, right eye: Secondary | ICD-10-CM | POA: Diagnosis not present

## 2022-09-29 DIAGNOSIS — Q141 Congenital malformation of retina: Secondary | ICD-10-CM | POA: Diagnosis not present

## 2022-10-04 DIAGNOSIS — H905 Unspecified sensorineural hearing loss: Secondary | ICD-10-CM | POA: Diagnosis not present

## 2022-11-03 ENCOUNTER — Encounter: Payer: Medicare Other | Admitting: Family

## 2023-01-12 ENCOUNTER — Ambulatory Visit: Payer: Medicare Other | Admitting: Family

## 2023-01-15 ENCOUNTER — Encounter: Payer: Self-pay | Admitting: Family

## 2023-03-06 ENCOUNTER — Ambulatory Visit (INDEPENDENT_AMBULATORY_CARE_PROVIDER_SITE_OTHER): Payer: Medicare Other

## 2023-03-06 DIAGNOSIS — Z23 Encounter for immunization: Secondary | ICD-10-CM

## 2023-05-08 DIAGNOSIS — L820 Inflamed seborrheic keratosis: Secondary | ICD-10-CM | POA: Diagnosis not present

## 2023-05-08 DIAGNOSIS — D0439 Carcinoma in situ of skin of other parts of face: Secondary | ICD-10-CM | POA: Diagnosis not present

## 2023-05-08 DIAGNOSIS — L578 Other skin changes due to chronic exposure to nonionizing radiation: Secondary | ICD-10-CM | POA: Diagnosis not present

## 2023-05-08 DIAGNOSIS — L570 Actinic keratosis: Secondary | ICD-10-CM | POA: Diagnosis not present

## 2023-05-08 DIAGNOSIS — L821 Other seborrheic keratosis: Secondary | ICD-10-CM | POA: Diagnosis not present

## 2023-05-08 DIAGNOSIS — D485 Neoplasm of uncertain behavior of skin: Secondary | ICD-10-CM | POA: Diagnosis not present

## 2023-05-08 DIAGNOSIS — L538 Other specified erythematous conditions: Secondary | ICD-10-CM | POA: Diagnosis not present

## 2023-05-24 ENCOUNTER — Encounter: Payer: Self-pay | Admitting: Nurse Practitioner

## 2023-05-24 ENCOUNTER — Ambulatory Visit (INDEPENDENT_AMBULATORY_CARE_PROVIDER_SITE_OTHER): Payer: Medicare Other | Admitting: Nurse Practitioner

## 2023-05-24 VITALS — BP 146/84 | HR 55 | Temp 97.8°F | Ht 68.0 in | Wt 125.0 lb

## 2023-05-24 DIAGNOSIS — F5101 Primary insomnia: Secondary | ICD-10-CM

## 2023-05-24 DIAGNOSIS — H0012 Chalazion right lower eyelid: Secondary | ICD-10-CM

## 2023-05-24 MED ORDER — ZOLPIDEM TARTRATE 10 MG PO TABS: 10.0000 mg | ORAL_TABLET | Freq: Every evening | ORAL | 1 refills | Status: DC | PRN

## 2023-05-24 MED ORDER — CIPROFLOXACIN HCL 0.3 % OP SOLN: 2.0000 [drp] | OPHTHALMIC | 0 refills | Status: AC

## 2023-05-24 NOTE — Addendum Note (Signed)
Addended by: Bennie Pierini on: 05/24/2023 12:13 PM   Modules accepted: Orders

## 2023-05-24 NOTE — Progress Notes (Addendum)
Subjective:    Patient ID: Adrienne Jacobs, female    DOB: 1951-05-07, 72 y.o.   MRN: [id number]   Chief Complaint: stye  Patient has a reoccurring stye that wont go away. She has had it for several months. She was prescribed polysporin eye ointment. No better.   Also trouble sleeping- has talked with Jannifer Rodney in the past - was offered [place] but did not want to try at time. Would like to try now. No problem falling asleep, staying asleep is her issue. Melatonin is not helping.  Patient Active Problem List   Diagnosis Date Noted   Atrophic vaginitis 12/23/2021   Ocular migraine 12/23/2021   Vertigo 12/23/2021   Osteoporosis without pathological fracture 05/11/2020   Urgency incontinence 05/06/2015       Review of Systems  Constitutional:  Negative for diaphoresis.  Eyes:  Negative for pain.  Respiratory:  Negative for shortness of breath.   Cardiovascular:  Negative for chest pain, palpitations and leg swelling.  Gastrointestinal:  Negative for abdominal pain.  Endocrine: Negative for polydipsia.  Skin:  Negative for rash.  Neurological:  Negative for dizziness, weakness and headaches.  Hematological:  Does not bruise/bleed easily.  All other systems reviewed and are negative.      Objective:   Physical Exam Vitals and nursing note reviewed.  Constitutional:      General: She is not in acute distress.    Appearance: Normal appearance. She is well-developed.  Eyes:     Pupils: Pupils are equal, round, and reactive to light.     Comments: Erythematous raised lesion on right lower inner lid. Watery like drainage.  Neck:     Vascular: No carotid bruit or JVD.  Cardiovascular:     Rate and Rhythm: Normal rate and regular rhythm.     Heart sounds: Normal heart sounds.  Pulmonary:     Effort: Pulmonary effort is normal. No respiratory distress.     Breath sounds: Normal breath sounds. No wheezing or rales.  Chest:     Chest wall: No tenderness.  Abdominal:      General: Bowel sounds are normal. There is no distension or abdominal bruit.     Palpations: Abdomen is soft. There is no hepatomegaly, splenomegaly, mass or pulsatile mass.     Tenderness: There is no abdominal tenderness.  Musculoskeletal:        General: Normal range of motion.     Cervical back: Normal range of motion and neck supple.  Lymphadenopathy:     Cervical: No cervical adenopathy.  Skin:    General: Skin is warm and dry.  Neurological:     Mental Status: She is alert and oriented to person, place, and time.     Deep Tendon Reflexes: Reflexes are normal and symmetric.  Psychiatric:        Behavior: Behavior normal.        Thought Content: Thought content normal.        Judgment: Judgment normal.    BP (!) 146/84   Pulse (!) 55   Temp 97.8 F (36.6 C) (Temporal)   Ht 5\' 8"  (1.727 m)   Wt 125 lb (56.7 kg)   SpO2 97%   BMI 19.01 kg/m         Assessment & Plan:   Adrienne Jacobs in today with chief complaint of No chief complaint on file.   1. Chalazion of right lower eyelid (Primary) Moist compresses Avoid rubbing eye Good handwashing  2. Primary insomnia  Bedtime routine - zolpidem (AMBIEN) 10 MG tablet; Take 1 tablet (10 mg total) by mouth at bedtime as needed for sleep.  Dispense: 15 tablet; Refill: 1    The above assessment and management plan was discussed with the patient. The patient verbalized understanding of and has agreed to the management plan. Patient is aware to call the clinic if symptoms persist or worsen. Patient is aware when to return to the clinic for a follow-up visit. Patient educated on when it is appropriate to go to the emergency department.   Adrienne Daphine Deutscher, FNP

## 2023-05-24 NOTE — Patient Instructions (Signed)
Chalazion  A chalazion is a swelling or lump on the eyelid. It can affect the upper eyelid or the lower eyelid. What are the causes? This condition may be caused by: Long-lasting (chronic) inflammation of the eyelid glands. A blocked oil gland in the eyelid. What are the signs or symptoms? Symptoms of this condition include: Swelling of the eyelid that: May spread to areas around the eye. May be painful. A hard lump on the eyelid. Blurry vision. The lump may make it hard to see out of the eye. How is this diagnosed? This condition is diagnosed with an examination of the eye. How is this treated? This condition is treated by applying a warm, moist cloth (warm compress) to the eyelid. If the condition does not improve, it may be treated with: Medicine that is applied to the eye. Oral medicines. Medicine that is injected into the chalazion. Surgery. Follow these instructions at home: Managing pain and swelling Apply a warm compress to the eyelid for 10-15 minutes, 4 to 6 times a day. This will help to open any blocked glands and to reduce redness and swelling. Take and apply over-the-counter and prescription medicines only as told by your health care provider. General instructions Do not touch the chalazion. Do not try to remove the pus. Do not squeeze the chalazion or stick it with a pin or needle. Do not rub your eyes. Wash your hands often with soap and water for at least 20 seconds. Dry your hands with a clean towel. Keep your face, scalp, and eyebrows clean. Avoid wearing eye makeup. Keep all follow-up visits. This is important. Contact a health care provider if: Your eyelid is getting worse. You have a fever. The chalazion does not break open (rupture) or go away on its own and your eyelid has not improved for 4 weeks. Get help right away if: You have pain in your eye. Your vision worsens. The chalazion becomes painful or red. The chalazion gets bigger. Summary A  chalazion is a swelling or lump on the upper or lower eyelid. It may be caused by chronic inflammation or a blocked oil gland. Apply a warm compress to the eyelid for 10-15 minutes, 4 to 6 times a day. Keep your face, scalp, and eyebrows clean. This information is not intended to replace advice given to you by your health care provider. Make sure you discuss any questions you have with your health care provider. Document Revised: 07/28/2020 Document Reviewed: 07/28/2020 Elsevier Patient Education  2024 [employer].

## 2024-02-19 ENCOUNTER — Ambulatory Visit: Admitting: Family

## 2024-02-19 ENCOUNTER — Encounter: Payer: Self-pay | Admitting: Family

## 2024-02-19 ENCOUNTER — Ambulatory Visit (INDEPENDENT_AMBULATORY_CARE_PROVIDER_SITE_OTHER): Admitting: Family

## 2024-02-19 VITALS — BP 140/82 | HR 57 | Temp 97.5°F | Ht 68.0 in | Wt 124.0 lb

## 2024-02-19 DIAGNOSIS — B3731 Acute candidiasis of vulva and vagina: Secondary | ICD-10-CM

## 2024-02-19 DIAGNOSIS — R1030 Lower abdominal pain, unspecified: Secondary | ICD-10-CM | POA: Diagnosis not present

## 2024-02-19 DIAGNOSIS — N898 Other specified noninflammatory disorders of vagina: Secondary | ICD-10-CM | POA: Diagnosis not present

## 2024-02-19 MED ORDER — FLUCONAZOLE 150 MG PO TABS: 150.0000 mg | ORAL_TABLET | ORAL | 0 refills | Status: AC | PRN

## 2024-02-19 NOTE — Progress Notes (Signed)
 Subjective:    Patient ID: Adrienne Jacobs, female    DOB: 12/10/1950, 73 y.o.   MRN: [id number]  Chief Complaint  Patient presents with   Vaginitis    REDNESS AND SWELLING    HPI PT presents to the office today with vaginal swelling and erythemas that she notice three days. Denies any discharge, fever, dysuria,  or itching. She reports a mild lower abdominal fullness.   Does report urinary frequency.   Review of Systems  All other systems reviewed and are negative.   Social History   Socioeconomic History   Marital status: Divorced    Spouse name: Lynwood   Number of children: 0   Years of education: Not on file   Highest education level: High school graduate  Occupational History   Occupation: RETIRED  Tobacco Use   Smoking status: Never   Smokeless tobacco: Never  Vaping Use   Vaping status: Never Used  Substance and Sexual Activity   Alcohol use: No   Drug use: No   Sexual activity: Not on file  Other Topics Concern   Not on file  Social History Narrative   Lives with ex husband   Caffeine- coffee 2 c   Social Drivers of Corporate investment banker Strain: Not on file  Food Insecurity: No Food Insecurity (03/04/2021)   Hunger Vital Sign    Worried About Running Out of Food in the Last Year: Never true    Ran Out of Food in the Last Year: Never true  Transportation Needs: Not on file  Physical Activity: Not on file  Stress: Stress Concern Present (03/04/2021)   [employer] of Occupational Health - Occupational Stress Questionnaire    Feeling of Stress : Rather much  Social Connections: Not on file   Family History  Problem Relation Age of Onset   Other Mother        liver failure   Stroke Father    Hypertension Father    Hypertension Sister    Diabetes Maternal Uncle         Objective:   Physical Exam Vitals reviewed.  Constitutional:      General: She is not in acute distress.    Appearance: She is well-developed.  HENT:     Head:  Normocephalic and atraumatic.  Eyes:     Pupils: Pupils are equal, round, and reactive to light.  Neck:     Thyroid : No thyromegaly.  Cardiovascular:     Rate and Rhythm: Normal rate and regular rhythm.     Heart sounds: Normal heart sounds. No murmur heard. Pulmonary:     Effort: Pulmonary effort is normal. No respiratory distress.     Breath sounds: Normal breath sounds. No wheezing.  Abdominal:     General: Bowel sounds are normal. There is no distension.     Palpations: Abdomen is soft.     Tenderness: There is no abdominal tenderness.  Genitourinary:    Comments: Labia erythemas, no discharge present  Musculoskeletal:        General: No tenderness. Normal range of motion.     Cervical back: Normal range of motion and neck supple.  Skin:    General: Skin is warm and dry.  Neurological:     Mental Status: She is alert and oriented to person, place, and time.     Cranial Nerves: No cranial nerve deficit.     Deep Tendon Reflexes: Reflexes are normal and symmetric.  Psychiatric:  Behavior: Behavior normal.        Thought Content: Thought content normal.        Judgment: Judgment normal.       BP (!) 140/82   Pulse (!) 57   Temp (!) 97.5 F (36.4 C)   Ht 5' 8 (1.727 m)   Wt 124 lb (56.2 kg)   SpO2 99%   BMI 18.85 kg/m      Assessment & Plan:  Eylin Pontarelli comes in today with chief complaint of Vaginitis (REDNESS AND SWELLING)   Diagnosis and orders addressed:  1. Vaginal irritation (Primary) - Urinalysis, Complete - WET PREP FOR TRICH, YEAST, CLUE  2. Lower abdominal pain - Urinalysis, Complete  3. Vagina, candidiasis Keep clean and dry Avoid scratching  Probiotic  Follow up if symptoms worsen or do not improve  - fluconazole  (DIFLUCAN ) 150 MG tablet; Take 1 tablet (150 mg total) by mouth every three (3) days as needed.  Dispense: 3 tablet; Refill: 0     Bari Learn, FNP

## 2024-02-19 NOTE — Patient Instructions (Signed)

## 2024-02-20 LAB — URINALYSIS, COMPLETE
Bilirubin, UA: NEGATIVE
Glucose, UA: NEGATIVE
Ketones, UA: NEGATIVE
Leukocytes,UA: NEGATIVE
Nitrite, UA: NEGATIVE
Protein,UA: NEGATIVE
RBC, UA: NEGATIVE
Specific Gravity, UA: 1.01 (ref 1.005–1.030)
Urobilinogen, Ur: 0.2 mg/dL (ref 0.2–1.0)
pH, UA: 7 (ref 5.0–7.5)

## 2024-02-20 LAB — WET PREP FOR TRICH, YEAST, CLUE
Clue Cell Exam: NEGATIVE
Trichomonas Exam: NEGATIVE
Yeast Exam: POSITIVE — AB

## 2024-02-21 ENCOUNTER — Ambulatory Visit: Payer: Self-pay | Admitting: Family

## 2024-04-02 ENCOUNTER — Ambulatory Visit

## 2024-04-16 ENCOUNTER — Ambulatory Visit: Payer: Self-pay
